# Patient Record
Sex: Male | Born: 1945
Health system: Southern US, Community
[De-identification: ages and names within clinical notes are randomized; demographics above are authoritative.]

## PROBLEM LIST (undated history)

## (undated) DIAGNOSIS — G4730 Sleep apnea, unspecified: Secondary | ICD-10-CM

## (undated) DIAGNOSIS — M1990 Unspecified osteoarthritis, unspecified site: Secondary | ICD-10-CM

## (undated) DIAGNOSIS — I10 Essential (primary) hypertension: Secondary | ICD-10-CM

## (undated) DIAGNOSIS — C4359 Malignant melanoma of other part of trunk: Secondary | ICD-10-CM

## (undated) DIAGNOSIS — M2540 Effusion, unspecified joint: Secondary | ICD-10-CM

## (undated) DIAGNOSIS — M2550 Pain in unspecified joint: Secondary | ICD-10-CM

## (undated) DIAGNOSIS — D649 Anemia, unspecified: Secondary | ICD-10-CM

## (undated) DIAGNOSIS — R011 Cardiac murmur, unspecified: Secondary | ICD-10-CM

## (undated) DIAGNOSIS — T4145XA Adverse effect of unspecified anesthetic, initial encounter: Secondary | ICD-10-CM

## (undated) DIAGNOSIS — T8859XA Other complications of anesthesia, initial encounter: Secondary | ICD-10-CM

## (undated) DIAGNOSIS — K219 Gastro-esophageal reflux disease without esophagitis: Secondary | ICD-10-CM

## (undated) DIAGNOSIS — E7800 Pure hypercholesterolemia, unspecified: Secondary | ICD-10-CM

## (undated) HISTORY — PX: KNEE ARTHROSCOPY: SHX127

## (undated) HISTORY — PX: EYE SURGERY: SHX253

## (undated) HISTORY — PX: TONSILLECTOMY: SUR1361

## (undated) HISTORY — PX: TOTAL KNEE ARTHROPLASTY: SHX125

---

## 1979-02-16 DIAGNOSIS — C4359 Malignant melanoma of other part of trunk: Secondary | ICD-10-CM

## 1979-02-16 HISTORY — PX: MELANOMA EXCISION: SHX5266

## 1979-02-16 HISTORY — DX: Malignant melanoma of other part of trunk: C43.59

## 2004-11-29 ENCOUNTER — Encounter: Admission: RE | Admit: 2004-11-29 | Discharge: 2004-11-29 | Payer: Self-pay | Admitting: Orthopedic Surgery

## 2008-02-04 ENCOUNTER — Inpatient Hospital Stay (HOSPITAL_COMMUNITY): Admission: RE | Admit: 2008-02-04 | Discharge: 2008-02-07 | Payer: Self-pay | Admitting: Orthopedic Surgery

## 2010-10-30 NOTE — Op Note (Signed)
NAME:  Casey Donaldson, Casey Donaldson NO.:  [id number]   MEDICAL RECORD NO.:  [id number]          PATIENT TYPE:  INP   LOCATION:  0002                         FACILITY:  [facility]   PHYSICIAN:  John L. Rendall, M.D.  DATE OF BIRTH:  1946/04/13   DATE OF PROCEDURE:  02/04/2008  DATE OF DISCHARGE:                               OPERATIVE REPORT   PREOPERATIVE DIAGNOSIS:  Osteoarthritis right knee with contractures.   SURGICAL PROCEDURES:  Right LCS total knee arthroplasty with computer  navigation assistance.   POSTOPERATIVE DIAGNOSIS:  Osteoarthritis right knee with contractures.   SURGEON:  John L. Rendall, M.D.   ASSISTANT:  Arnoldo Morale [facility]   ANESTHESIA:  General with femoral nerve block.   PATHOLOGY:  The patient has a worn out medial compartment of the knee  with 9 degrees varus deformity and a fixed flexion contracture of 15  degrees.  He has pain with weightbearing and all conservative measures  have failed to relieve it.   PROCEDURE:  Under general anesthesia with the femoral nerve block, the  right leg was prepared with DuraPrep and draped as a sterile field,  sterile tourniquet is applied proximally.  The leg was wrapped out with  the Esmarch and the tourniquet is elevated at 350 mm.  Midline incision  is made.  The patella was everted.  The knee is debrided in preparation  for computer mapping.  The femur is estimated to be a standard plus.  Using first the computer, the hip center is identified, medial and  lateral malleoli are identified and mapping of the proximal tibia and  distal femur are done within 0.4 mm of reproducible accuracy.  Once this  was completed proximal tibial resection is carried out using the  computer assistance and placement of the cutting jig.  Once this was  complete and within 1 degree of anatomic alignment, the balancing is  then done within 1.5 degrees of anatomic alignment.  The first femoral  cut was then made resecting the anterior and  posterior flare of the  distal femur.  No notching was encountered.  Using the second, the  distal femoral cut was made. Gaps were balanced at approximately 12.5 to  15 mm.  At this point, the lamina spreader was inserted.  Remnants of  the menisci and cruciates were debrided and the posterior femoral  condylar spurs were removed with osteotome and mallet.  At this point,  the recessing guide was used.  Following this, the [facility]  inserted.  Proximal tibia was exposed and sized at a #4.  Due to the  gentleman's excessive truncal obesity, decision was made to use an MBT  revision tray to give more purchase in the proximal tibia to help  prevent early loosening and failure.  Trial components for the number 4  revision tray were then inserted along with a 15 bearing.  This revealed  excellent fit, but there was some laxity in extension and 17.5 bearing  was a much better fit, alignment within 2.5 degrees of anatomic axis,  complete relief of the flexion contracture.  The arrays were taken down  at this point.  Permanent components obtained.  All components were then  cemented in place after preparation of the bony surfaces with pulse  irrigation.  Once cement had hardened, excess was removed with osteotome  and mallet.  The tourniquet was let down at an hour and 10 minutes.  Electrocautery was used and  bleeding was minimal, less than 100 mL.  At this point with all  components cemented in place, the knee was closed in layers with #1  Tycron, #1 Vicryl, 2-0 Vicryl and skin clips.  The patient tolerated the  procedure well and returned to recovery in good condition.      John L. Rendall, M.D.  Electronically Signed     JLR/MEDQ  D:  02/04/2008  T:  02/04/2008  Job:  [id number]

## 2010-11-02 NOTE — Discharge Summary (Signed)
NAME:  Casey Donaldson, SOTH NO.:  [id number]   MEDICAL RECORD NO.:  [id number]          PATIENT TYPE:  INP   LOCATION:  1620                         FACILITY:  [facility]   PHYSICIAN:  John L. Rendall, M.D.  DATE OF BIRTH:  [birth year]   DATE OF ADMISSION:  02/04/2008  DATE OF DISCHARGE:  02/07/2008                               DISCHARGE SUMMARY   ADMISSION DIAGNOSES:  1. End-stage osteoarthritis, right knee.  2. Hypertension.  3. Obesity.  4. Hypercholesterolemia.  5. Sleep apnea.  6. History of malignant melanoma.   DISCHARGE DIAGNOSES:  1. End-stage osteoarthritis, right knee status post right total knee      arthroplasty.  2. Acute blood loss anemia secondary to surgery.  3. Hypertension.  4. Obesity.  5. Hypercholesterolemia.  6. Sleep apnea.  7. History of malignant melanoma.   SURGICAL PROCEDURES:  On February 04, 2008 Mr. Roehrig underwent a right  total knee arthroplasty with computer navigation by Dr. Jonny Ruiz L. Rendall  assisted by Arnoldo Morale, PA-C.  He had an LSC complete RP insert size  standard plus 17.5 thickness placed with an LSC complete metal back  patella cemented size standard plus.  A NBT revision cemented tray size  4 and an LSC complete primary femoral component cemented size standard  plus, right.   Complications:  None.   CONSULTANT:  1. Physical therapy consult, February 04, 2008.  2. Case management consult and occupational therapy consult, February 05, 2008.  3. Respiratory therapy consult, February 06, 2008.   HISTORY OF PRESENT ILLNESS:  This 65 year old white male patient  presented to Dr. Priscille Kluver with a 4-year history of gradual onset of  progressive right knee pain.  The pain is an intermittent dull sensation  over the anterior knee without radiation.  It increases with prolonged  standing and walking and decreases with Aleve and wrapping the knee.  The knee pops and grinds occasionally and keeps him up at night.  He has  failed conservative treatment and because of that he is presenting for  right knee replacement.   HOSPITAL COURSE:  Mr. Gunnels tolerated the surgical procedure well  without immediate postoperative complications.  Postop day #1, he was  afebrile, vitals were stable.  Pain was well controlled.  Hemoglobin was  11.1, hematocrit 32.9.  He was weaned off his oxygen and started on home  teaching and therapy per protocol.   Postop day #2, remained afebrile.  Hemoglobin dropped to 10 with  hematocrit of 29.3.  Dressing was changed.  Incision was well-  approximated with staples.  He was switched to p.o. pain meds and  continued on therapy.   He continued to do well over the next day.  Hemoglobin and hematocrit  remained stable.  Pain was controlled with p.o. pain meds.  It was felt  on the twenty-third that he was ready for discharge home and he was  discharged home later that day.   DISCHARGE INSTRUCTIONS:  1. Diet:  He is to resume his regular prehospitalization diet.  2. Medications:  He is to resume his home meds as  follows:      a.     Hydrochlorothiazide 25 mg p.o. q.a.m.      b.     Lipitor 40 mg p.o. nightly.      c.     Enalapril 20 mg p.o. b.i.d.      d.     Flumethasone ophthalmic solution applied after surgery.      e.     Multivitamin one p.o. q.a.m..      f.     He is to hold his tramadol, aspirin, Aleve and chondroitin       glucosamine and Tylenol at this time.  3. Additional meds at this time include:      a.     Arixtra 2.5 mg subcu q. 8 p.m. with the last dose on August       26.  On the twenty-seventh he may resume his daily aspirin.      b.     Celebrex 200 mg p.o. b.i.d. for 1 week and then 1 tablet       p.o. q.a.m. 30 with no refill.      c.     Percocet 5/325 one to two tablets p.o. q.4h. p.r.n. for pain       60 with no refill.      d.     Robaxin 500 mg 1-2 tablets p.o. q.6h. p.r.n. for spasms 60       with no refill.  4. Activity:  He can be out of bed  weightbearing as tolerated on the      right leg with use of the walker.  He is to have home health and PT      per [facility] and home CPM zero to 90 degrees 6-8      hours a day.  Please see the blue total knee discharge sheet for      further activity instructions.  5. Wound care:  He is to keep his incision clean and dry.  May shower      after no drainage from the wound for 2 days.  Please see the blue      total knee discharge sheet for further wound care instructions.  6. Follow-up:  He is to follow up with Dr. Priscille Kluver in our office on      Tuesday, September 1 and needs to call [phone] for that      appointment.   LABORATORY DATA:  Hemoglobin and hematocrit ranged from 13.5 and 39.7 on  the seventeenth to 10 and 29.3 on the twenty-third.  White count went  from 7.1 on the seventeenth to a high of 11.5 on the twenty-second to  11.1 on the twenty-third.  Sodium dropped to a low of 134 on the twenty-  first and then was within normal limits.  Glucose ranged from 113 on the  seventeenth to 153 on the twenty-second.  All other laboratory studies  were within normal limits.      Legrand Pitts Duffy, P.A.      John L. Rendall, M.D.  Electronically Signed   KED/MEDQ  D:  02/24/2008  T:  02/24/2008  Job:  [id number]   cc:   Gwendlyn Deutscher II, M.D.  Fax: [phone]

## 2013-02-15 DIAGNOSIS — D649 Anemia, unspecified: Secondary | ICD-10-CM

## 2013-02-15 HISTORY — DX: Anemia, unspecified: D64.9

## 2013-02-22 ENCOUNTER — Other Ambulatory Visit: Payer: Self-pay | Admitting: Orthopedic Surgery

## 2013-02-22 ENCOUNTER — Encounter (HOSPITAL_COMMUNITY): Payer: Self-pay | Admitting: Pharmacist

## 2013-02-22 NOTE — Progress Notes (Signed)
HAVE SENT A REQUEST TO [facility] IN [place] [phone]) TO HAVE THEM FAX MOST RECENT EKG, CXR, AND RESULTS FROM 2009 ECHO......THEIR FAX IS [phone]

## 2013-02-23 ENCOUNTER — Inpatient Hospital Stay (HOSPITAL_COMMUNITY)
Admission: RE | Admit: 2013-02-23 | Discharge: 2013-02-26 | DRG: 467 | Disposition: A | Discharge status: Skilled Nursing Facility | Payer: Medicare Other | Source: Ambulatory Visit | Attending: Orthopedic Surgery | Admitting: Orthopedic Surgery

## 2013-02-23 ENCOUNTER — Inpatient Hospital Stay (HOSPITAL_COMMUNITY): Payer: Medicare Other | Admitting: Anesthesiology

## 2013-02-23 ENCOUNTER — Encounter (HOSPITAL_COMMUNITY)
Admission: RE | Disposition: A | Discharge status: Skilled Nursing Facility | Payer: Self-pay | Source: Ambulatory Visit | Attending: Orthopedic Surgery

## 2013-02-23 ENCOUNTER — Inpatient Hospital Stay (HOSPITAL_COMMUNITY): Payer: Medicare Other

## 2013-02-23 ENCOUNTER — Encounter (HOSPITAL_COMMUNITY): Payer: Self-pay | Admitting: *Deleted

## 2013-02-23 ENCOUNTER — Encounter (HOSPITAL_COMMUNITY): Payer: Self-pay | Admitting: Anesthesiology

## 2013-02-23 DIAGNOSIS — T8453XA Infection and inflammatory reaction due to internal right knee prosthesis, initial encounter: Secondary | ICD-10-CM

## 2013-02-23 DIAGNOSIS — Y831 Surgical operation with implant of artificial internal device as the cause of abnormal reaction of the patient, or of later complication, without mention of misadventure at the time of the procedure: Secondary | ICD-10-CM | POA: Diagnosis present

## 2013-02-23 DIAGNOSIS — B954 Other streptococcus as the cause of diseases classified elsewhere: Secondary | ICD-10-CM | POA: Diagnosis present

## 2013-02-23 DIAGNOSIS — Z6841 Body Mass Index (BMI) 40.0 and over, adult: Secondary | ICD-10-CM

## 2013-02-23 DIAGNOSIS — M009 Pyogenic arthritis, unspecified: Secondary | ICD-10-CM

## 2013-02-23 DIAGNOSIS — M869 Osteomyelitis, unspecified: Secondary | ICD-10-CM

## 2013-02-23 DIAGNOSIS — T8450XA Infection and inflammatory reaction due to unspecified internal joint prosthesis, initial encounter: Principal | ICD-10-CM

## 2013-02-23 DIAGNOSIS — Z96659 Presence of unspecified artificial knee joint: Secondary | ICD-10-CM

## 2013-02-23 DIAGNOSIS — I10 Essential (primary) hypertension: Secondary | ICD-10-CM | POA: Diagnosis present

## 2013-02-23 DIAGNOSIS — T8459XA Infection and inflammatory reaction due to other internal joint prosthesis, initial encounter: Secondary | ICD-10-CM

## 2013-02-23 DIAGNOSIS — K219 Gastro-esophageal reflux disease without esophagitis: Secondary | ICD-10-CM | POA: Diagnosis present

## 2013-02-23 DIAGNOSIS — G4730 Sleep apnea, unspecified: Secondary | ICD-10-CM | POA: Diagnosis present

## 2013-02-23 DIAGNOSIS — E871 Hypo-osmolality and hyponatremia: Secondary | ICD-10-CM | POA: Diagnosis present

## 2013-02-23 DIAGNOSIS — R339 Retention of urine, unspecified: Secondary | ICD-10-CM | POA: Diagnosis not present

## 2013-02-23 DIAGNOSIS — Z85820 Personal history of malignant melanoma of skin: Secondary | ICD-10-CM

## 2013-02-23 HISTORY — DX: Cardiac murmur, unspecified: R01.1

## 2013-02-23 HISTORY — PX: EXCISIONAL TOTAL KNEE ARTHROPLASTY WITH ANTIBIOTIC SPACERS: SHX5827

## 2013-02-23 HISTORY — DX: Gastro-esophageal reflux disease without esophagitis: K21.9

## 2013-02-23 HISTORY — DX: Sleep apnea, unspecified: G47.30

## 2013-02-23 HISTORY — DX: Essential (primary) hypertension: I10

## 2013-02-23 LAB — CBC
MCH: 32.6 pg (ref 26.0–34.0)
MCV: 91.8 fL (ref 78.0–100.0)
Platelets: 231 10*3/uL (ref 150–400)
RBC: 3.89 MIL/uL — ABNORMAL LOW (ref 4.22–5.81)

## 2013-02-23 LAB — BASIC METABOLIC PANEL
BUN: 19 mg/dL (ref 6–23)
CO2: 23 mEq/L (ref 19–32)
Calcium: 11.1 mg/dL — ABNORMAL HIGH (ref 8.4–10.5)
Creatinine, Ser: 0.76 mg/dL (ref 0.50–1.35)
Glucose, Bld: 108 mg/dL — ABNORMAL HIGH (ref 70–99)

## 2013-02-23 SURGERY — REMOVAL, TOTAL ARTHROPLASTY HARDWARE, KNEE, WITH ANTIBIOTIC SPACER INSERTION
Anesthesia: General | Site: Knee | Laterality: Right | Wound class: Dirty or Infected

## 2013-02-23 MED ORDER — CEFAZOLIN SODIUM-DEXTROSE 2-3 GM-% IV SOLR
2.0000 g | Freq: Four times a day (QID) | INTRAVENOUS | Status: DC
  Administered 2013-02-23 – 2013-02-24 (×3): 2 g via INTRAVENOUS
  Filled 2013-02-23 (×6): qty 50

## 2013-02-23 MED ORDER — CEFTRIAXONE SODIUM 2 G IJ SOLR
2.0000 g | INTRAMUSCULAR | Status: DC
  Administered 2013-02-24 – 2013-02-25 (×2): 2 g via INTRAVENOUS
  Filled 2013-02-23 (×4): qty 2

## 2013-02-23 MED ORDER — ACETAMINOPHEN 325 MG PO TABS: 650.0000 mg | ORAL_TABLET | Freq: Four times a day (QID) | ORAL | Status: DC | PRN

## 2013-02-23 MED ORDER — SUCCINYLCHOLINE CHLORIDE 20 MG/ML IJ SOLN: INTRAMUSCULAR | Status: DC | PRN

## 2013-02-23 MED ORDER — ALBUTEROL SULFATE (5 MG/ML) 0.5% IN NEBU
INHALATION_SOLUTION | RESPIRATORY_TRACT | Status: AC
  Filled 2013-02-23: qty 0.5

## 2013-02-23 MED ORDER — FENTANYL CITRATE 0.05 MG/ML IJ SOLN
INTRAMUSCULAR | Status: DC | PRN
  Administered 2013-02-23 (×2): 50 ug via INTRAVENOUS
  Administered 2013-02-23 (×2): 100 ug via INTRAVENOUS
  Administered 2013-02-23 (×3): 50 ug via INTRAVENOUS
  Administered 2013-02-23: 100 ug via INTRAVENOUS

## 2013-02-23 MED ORDER — MIDAZOLAM HCL 5 MG/5ML IJ SOLN
INTRAMUSCULAR | Status: DC | PRN
  Administered 2013-02-23: 2 mg via INTRAVENOUS

## 2013-02-23 MED ORDER — OXYCODONE HCL 5 MG PO TABS: 5.0000 mg | ORAL_TABLET | Freq: Once | ORAL | Status: DC | PRN

## 2013-02-23 MED ORDER — METOCLOPRAMIDE HCL 5 MG/ML IJ SOLN
INTRAMUSCULAR | Status: DC | PRN
  Administered 2013-02-23: 10 mg via INTRAVENOUS

## 2013-02-23 MED ORDER — ATORVASTATIN CALCIUM 10 MG PO TABS
10.0000 mg | ORAL_TABLET | Freq: Every day | ORAL | Status: DC
  Administered 2013-02-24 – 2013-02-25 (×2): 10 mg via ORAL
  Filled 2013-02-23 (×3): qty 1

## 2013-02-23 MED ORDER — FLUTICASONE PROPIONATE 50 MCG/ACT NA SUSP
2.0000 | Freq: Every day | NASAL | Status: DC
  Administered 2013-02-23 – 2013-02-25 (×3): 2 via NASAL
  Filled 2013-02-23 (×2): qty 16

## 2013-02-23 MED ORDER — GLYCOPYRROLATE 0.2 MG/ML IJ SOLN
INTRAMUSCULAR | Status: DC | PRN
  Administered 2013-02-23: .8 mg via INTRAVENOUS

## 2013-02-23 MED ORDER — NEOSTIGMINE METHYLSULFATE 1 MG/ML IJ SOLN
INTRAMUSCULAR | Status: DC | PRN
  Administered 2013-02-23: 5 mg via INTRAVENOUS

## 2013-02-23 MED ORDER — ALBUTEROL SULFATE HFA 108 (90 BASE) MCG/ACT IN AERS: 2.0000 | INHALATION_SPRAY | RESPIRATORY_TRACT | Status: DC

## 2013-02-23 MED ORDER — HYDROMORPHONE HCL PF 1 MG/ML IJ SOLN
INTRAMUSCULAR | Status: AC
  Filled 2013-02-23: qty 1

## 2013-02-23 MED ORDER — VANCOMYCIN HCL 1000 MG IV SOLR
INTRAVENOUS | Status: DC | PRN
  Administered 2013-02-23 (×4): 1000 mg

## 2013-02-23 MED ORDER — LACTATED RINGERS IV SOLN
INTRAVENOUS | Status: DC
  Administered 2013-02-23: 14:00:00 via INTRAVENOUS

## 2013-02-23 MED ORDER — LIDOCAINE HCL (CARDIAC) 20 MG/ML IV SOLN
INTRAVENOUS | Status: DC | PRN
  Administered 2013-02-23: 65 mg via INTRAVENOUS

## 2013-02-23 MED ORDER — ALBUTEROL SULFATE (5 MG/ML) 0.5% IN NEBU
2.5000 mg | INHALATION_SOLUTION | Freq: Four times a day (QID) | RESPIRATORY_TRACT | Status: DC | PRN
  Administered 2013-02-23: 2.5 mg via RESPIRATORY_TRACT

## 2013-02-23 MED ORDER — MINERAL OIL LIGHT 100 % EX OIL
TOPICAL_OIL | CUTANEOUS | Status: AC
  Filled 2013-02-23: qty 25

## 2013-02-23 MED ORDER — SODIUM CHLORIDE 0.9 % IJ SOLN: 9.0000 mL | INTRAMUSCULAR | Status: DC | PRN

## 2013-02-23 MED ORDER — FELODIPINE ER 5 MG PO TB24
5.0000 mg | ORAL_TABLET | Freq: Every morning | ORAL | Status: DC
  Administered 2013-02-24 – 2013-02-26 (×3): 5 mg via ORAL
  Filled 2013-02-23 (×3): qty 1

## 2013-02-23 MED ORDER — METHOCARBAMOL 100 MG/ML IJ SOLN
500.0000 mg | Freq: Four times a day (QID) | INTRAVENOUS | Status: DC | PRN
  Filled 2013-02-23: qty 5

## 2013-02-23 MED ORDER — MORPHINE SULFATE (PF) 1 MG/ML IV SOLN
INTRAVENOUS | Status: DC
  Administered 2013-02-23 – 2013-02-24 (×2): via INTRAVENOUS
  Administered 2013-02-24: 4 mg via INTRAVENOUS
  Filled 2013-02-23: qty 25

## 2013-02-23 MED ORDER — POTASSIUM CHLORIDE IN NACL 20-0.9 MEQ/L-% IV SOLN
INTRAVENOUS | Status: DC
  Filled 2013-02-23 (×2): qty 1000

## 2013-02-23 MED ORDER — VANCOMYCIN HCL 1000 MG IV SOLR
INTRAVENOUS | Status: AC
  Filled 2013-02-23: qty 3000

## 2013-02-23 MED ORDER — MORPHINE SULFATE (PF) 1 MG/ML IV SOLN
INTRAVENOUS | Status: AC
  Administered 2013-02-24: 13.72 mg
  Filled 2013-02-23: qty 25

## 2013-02-23 MED ORDER — ONDANSETRON HCL 4 MG/2ML IJ SOLN
INTRAMUSCULAR | Status: DC | PRN
  Administered 2013-02-23: 4 mg via INTRAVENOUS

## 2013-02-23 MED ORDER — PROPOFOL 10 MG/ML IV BOLUS
INTRAVENOUS | Status: DC | PRN
  Administered 2013-02-23: 160 mg via INTRAVENOUS

## 2013-02-23 MED ORDER — ARTIFICIAL TEARS OP OINT
TOPICAL_OINTMENT | OPHTHALMIC | Status: DC | PRN
  Administered 2013-02-23: 1 via OPHTHALMIC

## 2013-02-23 MED ORDER — ONDANSETRON HCL 4 MG PO TABS
4.0000 mg | ORAL_TABLET | Freq: Four times a day (QID) | ORAL | Status: DC | PRN
  Administered 2013-02-24: 4 mg via ORAL
  Filled 2013-02-23: qty 1

## 2013-02-23 MED ORDER — LACTATED RINGERS IV SOLN
INTRAVENOUS | Status: DC | PRN
  Administered 2013-02-23 (×2): via INTRAVENOUS

## 2013-02-23 MED ORDER — NAFCILLIN SODIUM 1 G IJ SOLR
1.0000 g | INTRAMUSCULAR | Status: AC
  Administered 2013-02-23: 1 g via INTRAVENOUS
  Filled 2013-02-23: qty 1000

## 2013-02-23 MED ORDER — PHENYLEPHRINE HCL 10 MG/ML IJ SOLN
INTRAMUSCULAR | Status: DC | PRN
  Administered 2013-02-23: 40 ug via INTRAVENOUS
  Administered 2013-02-23 (×3): 80 ug via INTRAVENOUS

## 2013-02-23 MED ORDER — ROCURONIUM BROMIDE 100 MG/10ML IV SOLN
INTRAVENOUS | Status: DC | PRN
  Administered 2013-02-23: 30 mg via INTRAVENOUS
  Administered 2013-02-23: 50 mg via INTRAVENOUS
  Administered 2013-02-23: 20 mg via INTRAVENOUS

## 2013-02-23 MED ORDER — OXYCODONE HCL 5 MG PO TABS
5.0000 mg | ORAL_TABLET | ORAL | Status: DC | PRN
  Administered 2013-02-25: 5 mg via ORAL
  Administered 2013-02-26: 10 mg via ORAL
  Filled 2013-02-23: qty 1
  Filled 2013-02-23: qty 2

## 2013-02-23 MED ORDER — ENALAPRIL MALEATE 20 MG PO TABS
20.0000 mg | ORAL_TABLET | Freq: Two times a day (BID) | ORAL | Status: DC
  Administered 2013-02-23 – 2013-02-26 (×6): 20 mg via ORAL
  Filled 2013-02-23 (×7): qty 1

## 2013-02-23 MED ORDER — PROMETHAZINE HCL 25 MG/ML IJ SOLN: 6.2500 mg | INTRAMUSCULAR | Status: DC | PRN

## 2013-02-23 MED ORDER — ACETAMINOPHEN 650 MG RE SUPP: 650.0000 mg | Freq: Four times a day (QID) | RECTAL | Status: DC | PRN

## 2013-02-23 MED ORDER — MINERAL OIL LIGHT 100 % EX OIL
TOPICAL_OIL | CUTANEOUS | Status: DC | PRN
  Administered 2013-02-23: 1 via TOPICAL

## 2013-02-23 MED ORDER — VANCOMYCIN HCL 10 G IV SOLR
1500.0000 mg | Freq: Once | INTRAVENOUS | Status: AC
  Administered 2013-02-23: 1500 mg via INTRAVENOUS

## 2013-02-23 MED ORDER — BUPIVACAINE HCL (PF) 0.25 % IJ SOLN
INTRAMUSCULAR | Status: AC
  Filled 2013-02-23: qty 30

## 2013-02-23 MED ORDER — CEFAZOLIN (ANCEF) 1 G IV SOLR
INTRAVENOUS | Status: DC | PRN
  Administered 2013-02-23: 1 g via INTRAMUSCULAR

## 2013-02-23 MED ORDER — ALBUTEROL SULFATE HFA 108 (90 BASE) MCG/ACT IN AERS
INHALATION_SPRAY | RESPIRATORY_TRACT | Status: DC | PRN
  Administered 2013-02-23: 6 via RESPIRATORY_TRACT
  Administered 2013-02-23: 3 via RESPIRATORY_TRACT

## 2013-02-23 MED ORDER — SODIUM CHLORIDE 0.9 % IR SOLN
Status: DC | PRN
  Administered 2013-02-23: 6000 mL

## 2013-02-23 MED ORDER — VANCOMYCIN HCL 1000 MG IV SOLR
INTRAVENOUS | Status: AC
  Filled 2013-02-23: qty 1000

## 2013-02-23 MED ORDER — HYDROMORPHONE HCL PF 1 MG/ML IJ SOLN
0.2500 mg | INTRAMUSCULAR | Status: DC | PRN
  Administered 2013-02-23 (×2): 0.25 mg via INTRAVENOUS

## 2013-02-23 MED ORDER — BUPIVACAINE-EPINEPHRINE (PF) 0.5% -1:200000 IJ SOLN
INTRAMUSCULAR | Status: AC
  Filled 2013-02-23: qty 10

## 2013-02-23 MED ORDER — CEFAZOLIN (ANCEF) 1 G IV SOLR
1.0000 g | INTRAVENOUS | Status: AC
  Administered 2013-02-23: 1 g
  Filled 2013-02-23: qty 1

## 2013-02-23 MED ORDER — NAFCILLIN SODIUM 1 G IJ SOLR
1.0000 g | Freq: Once | INTRAMUSCULAR | Status: DC
  Filled 2013-02-23: qty 1000

## 2013-02-23 MED ORDER — NALOXONE HCL 0.4 MG/ML IJ SOLN: 0.4000 mg | INTRAMUSCULAR | Status: DC | PRN

## 2013-02-23 MED ORDER — WARFARIN SODIUM 7.5 MG PO TABS
7.5000 mg | ORAL_TABLET | Freq: Once | ORAL | Status: AC
  Administered 2013-02-24: 7.5 mg via ORAL
  Filled 2013-02-23 (×2): qty 1

## 2013-02-23 MED ORDER — METHOCARBAMOL 500 MG PO TABS
500.0000 mg | ORAL_TABLET | Freq: Four times a day (QID) | ORAL | Status: DC | PRN
  Administered 2013-02-25: 500 mg via ORAL
  Filled 2013-02-23: qty 1

## 2013-02-23 MED ORDER — DIPHENHYDRAMINE HCL 50 MG/ML IJ SOLN: 12.5000 mg | Freq: Four times a day (QID) | INTRAMUSCULAR | Status: DC | PRN

## 2013-02-23 MED ORDER — VANCOMYCIN HCL IN DEXTROSE 1-5 GM/200ML-% IV SOLN
INTRAVENOUS | Status: AC
  Filled 2013-02-23: qty 400

## 2013-02-23 MED ORDER — METOCLOPRAMIDE HCL 5 MG/ML IJ SOLN: 5.0000 mg | Freq: Three times a day (TID) | INTRAMUSCULAR | Status: DC | PRN

## 2013-02-23 MED ORDER — OXYCODONE HCL 5 MG/5ML PO SOLN
5.0000 mg | Freq: Once | ORAL | Status: DC | PRN
Start: 2013-02-23 — End: 2013-02-23

## 2013-02-23 MED ORDER — ONDANSETRON HCL 4 MG/2ML IJ SOLN: 4.0000 mg | Freq: Four times a day (QID) | INTRAMUSCULAR | Status: DC | PRN

## 2013-02-23 MED ORDER — CEFAZOLIN (ANCEF) 1 G IV SOLR
1.0000 g | INTRAVENOUS | Status: AC
  Filled 2013-02-23: qty 1

## 2013-02-23 MED ORDER — WARFARIN - PHARMACIST DOSING INPATIENT: Freq: Every day | Status: DC

## 2013-02-23 MED ORDER — CLONIDINE HCL (ANALGESIA) 100 MCG/ML EP SOLN
150.0000 ug | Freq: Once | EPIDURAL | Status: DC
  Filled 2013-02-23: qty 1.5

## 2013-02-23 MED ORDER — DIPHENHYDRAMINE HCL 12.5 MG/5ML PO ELIX: 12.5000 mg | ORAL_SOLUTION | Freq: Four times a day (QID) | ORAL | Status: DC | PRN

## 2013-02-23 MED ORDER — LABETALOL HCL 5 MG/ML IV SOLN
INTRAVENOUS | Status: DC | PRN
  Administered 2013-02-23: 2.5 mg via INTRAVENOUS
  Administered 2013-02-23: 5 mg via INTRAVENOUS

## 2013-02-23 MED ORDER — METOCLOPRAMIDE HCL 10 MG PO TABS: 5.0000 mg | ORAL_TABLET | Freq: Three times a day (TID) | ORAL | Status: DC | PRN

## 2013-02-23 MED ORDER — 0.9 % SODIUM CHLORIDE (POUR BTL) OPTIME
TOPICAL | Status: DC | PRN
  Administered 2013-02-23: 3000 mL

## 2013-02-23 MED ORDER — PHENOL 1.4 % MT LIQD: 1.0000 | OROMUCOSAL | Status: DC | PRN

## 2013-02-23 MED ORDER — NAFCILLIN SODIUM 1 G IJ SOLR
INTRAMUSCULAR | Status: DC | PRN
  Administered 2013-02-23: 1 g via INTRAVENOUS

## 2013-02-23 MED ORDER — MORPHINE SULFATE 4 MG/ML IJ SOLN: 4.0000 mg | INTRAMUSCULAR | Status: DC | PRN

## 2013-02-23 MED ORDER — MENTHOL 3 MG MT LOZG
1.0000 | LOZENGE | OROMUCOSAL | Status: DC | PRN
  Filled 2013-02-23: qty 9

## 2013-02-23 SURGICAL SUPPLY — 103 items
BANDAGE ELASTIC 4 VELCRO ST LF (GAUZE/BANDAGES/DRESSINGS) IMPLANT
BANDAGE ESMARK 6X9 LF (GAUZE/BANDAGES/DRESSINGS) IMPLANT
BLADE SAW SGTL 13.0X1.19X90.0M (BLADE) IMPLANT
BLADE SAW SGTL 81X20 HD (BLADE) ×4 IMPLANT
BLADE SURG 10 STRL SS (BLADE) ×10 IMPLANT
BLADE SURG 15 STRL LF DISP TIS (BLADE) ×2 IMPLANT
BLADE SURG 15 STRL SS (BLADE) ×4
BNDG CMPR 9X6 STRL LF SNTH (GAUZE/BANDAGES/DRESSINGS)
BNDG CMPR MED 10X6 ELC LF (GAUZE/BANDAGES/DRESSINGS) ×1
BNDG COHESIVE 6X5 TAN STRL LF (GAUZE/BANDAGES/DRESSINGS) IMPLANT
BNDG ELASTIC 6X10 VLCR STRL LF (GAUZE/BANDAGES/DRESSINGS) ×2 IMPLANT
BNDG ESMARK 6X9 LF (GAUZE/BANDAGES/DRESSINGS)
BOWL SMART MIX CTS (DISPOSABLE) ×4 IMPLANT
CATH URET WHISTLE 8FR 331008 (CATHETERS) ×2 IMPLANT
CEMENT HV SMART SET (Cement) ×8 IMPLANT
CLOTH BEACON ORANGE TIMEOUT ST (SAFETY) ×2 IMPLANT
CO AXIAL FAN SPRAY TIP SOFT SH (MISCELLANEOUS) ×2 IMPLANT
COVER BACK TABLE 24X17X13 BIG (DRAPES) IMPLANT
COVER SURGICAL LIGHT HANDLE (MISCELLANEOUS) ×2 IMPLANT
CUFF TOURNIQUET SINGLE 34IN LL (TOURNIQUET CUFF) IMPLANT
CUFF TOURNIQUET SINGLE 44IN (TOURNIQUET CUFF) ×2 IMPLANT
DRAPE INCISE IOBAN 66X45 STRL (DRAPES) ×4 IMPLANT
DRAPE ORTHO SPLIT 77X108 STRL (DRAPES) ×6
DRAPE PROXIMA HALF (DRAPES) ×2 IMPLANT
DRAPE SURG ORHT 6 SPLT 77X108 (DRAPES) ×3 IMPLANT
DRAPE U-SHAPE 47X51 STRL (DRAPES) ×2 IMPLANT
DRAPE X-RAY CASS 24X20 (DRAPES) IMPLANT
DRSG PAD ABDOMINAL 8X10 ST (GAUZE/BANDAGES/DRESSINGS) ×4 IMPLANT
DURAPREP 26ML APPLICATOR (WOUND CARE) ×2 IMPLANT
ELECT REM PT RETURN 9FT ADLT (ELECTROSURGICAL) ×2
ELECTRODE REM PT RTRN 9FT ADLT (ELECTROSURGICAL) ×1 IMPLANT
EVACUATOR 1/8 PVC DRAIN (DRAIN) IMPLANT
FACESHIELD LNG OPTICON STERILE (SAFETY) ×2 IMPLANT
FLUID NSS /IRRIG 3000 ML XXX (IV SOLUTION) IMPLANT
GAUZE XEROFORM 5X9 LF (GAUZE/BANDAGES/DRESSINGS) ×2 IMPLANT
GLOVE BIO SURGEON ST LM GN SZ9 (GLOVE) IMPLANT
GLOVE BIO SURGEON STRL SZ 6.5 (GLOVE) ×6 IMPLANT
GLOVE BIOGEL M 7.0 STRL (GLOVE) ×6 IMPLANT
GLOVE BIOGEL PI IND STRL 6 (GLOVE) ×1 IMPLANT
GLOVE BIOGEL PI IND STRL 6.5 (GLOVE) ×1 IMPLANT
GLOVE BIOGEL PI IND STRL 7.0 (GLOVE) ×1 IMPLANT
GLOVE BIOGEL PI IND STRL 7.5 (GLOVE) ×3 IMPLANT
GLOVE BIOGEL PI IND STRL 8 (GLOVE) ×1 IMPLANT
GLOVE BIOGEL PI INDICATOR 6 (GLOVE) ×1
GLOVE BIOGEL PI INDICATOR 6.5 (GLOVE) ×1
GLOVE BIOGEL PI INDICATOR 7.0 (GLOVE) ×1
GLOVE BIOGEL PI INDICATOR 7.5 (GLOVE) ×3
GLOVE BIOGEL PI INDICATOR 8 (GLOVE) ×1
GLOVE BIOGEL PI ORTHO PRO 7.5 (GLOVE) ×1
GLOVE ECLIPSE 7.0 STRL STRAW (GLOVE) ×2 IMPLANT
GLOVE ECLIPSE 7.5 STRL STRAW (GLOVE) ×2 IMPLANT
GLOVE PI ORTHO PRO STRL 7.5 (GLOVE) ×1 IMPLANT
GLOVE SS BIOGEL STRL SZ 8 (GLOVE) ×2 IMPLANT
GLOVE SUPERSENSE BIOGEL SZ 8 (GLOVE) ×2
GLOVE SURG ORTHO 8.0 STRL STRW (GLOVE) ×2 IMPLANT
GLOVE SURG SS PI 7.5 STRL IVOR (GLOVE) ×2 IMPLANT
GOWN PREVENTION PLUS LG XLONG (DISPOSABLE) IMPLANT
GOWN PREVENTION PLUS XLARGE (GOWN DISPOSABLE) ×8 IMPLANT
GOWN STRL NON-REIN LRG LVL3 (GOWN DISPOSABLE) ×6 IMPLANT
GOWN STRL REIN XL XLG (GOWN DISPOSABLE) ×2 IMPLANT
HANDPIECE INTERPULSE COAX TIP (DISPOSABLE) ×2
HOOD PEEL AWAY FACE SHEILD DIS (HOOD) ×14 IMPLANT
IMMOBILIZER KNEE 20 (SOFTGOODS)
IMMOBILIZER KNEE 20 THIGH 36 (SOFTGOODS) IMPLANT
IMMOBILIZER KNEE 22 UNIV (SOFTGOODS) IMPLANT
IMMOBILIZER KNEE 24 THIGH 36 (MISCELLANEOUS) ×1 IMPLANT
IMMOBILIZER KNEE 24 UNIV (MISCELLANEOUS) ×2
KIT BASIN OR (CUSTOM PROCEDURE TRAY) ×2 IMPLANT
KIT ROOM TURNOVER OR (KITS) ×2 IMPLANT
KIT STIMULAN RAPID CURE  10CC (Orthopedic Implant) ×2 IMPLANT
KIT STIMULAN RAPID CURE 10CC (Orthopedic Implant) ×2 IMPLANT
MANIFOLD NEPTUNE II (INSTRUMENTS) ×2 IMPLANT
NEEDLE 18GX1X1/2 (RX/OR ONLY) (NEEDLE) IMPLANT
NEEDLE SPNL 18GX3.5 QUINCKE PK (NEEDLE) IMPLANT
NS IRRIG 1000ML POUR BTL (IV SOLUTION) ×2 IMPLANT
PACK TOTAL JOINT (CUSTOM PROCEDURE TRAY) ×2 IMPLANT
PAD ARMBOARD 7.5X6 YLW CONV (MISCELLANEOUS) ×4 IMPLANT
PAD CAST 4YDX4 CTTN HI CHSV (CAST SUPPLIES) ×1 IMPLANT
PADDING CAST COTTON 4X4 STRL (CAST SUPPLIES) ×2
PADDING CAST COTTON 6X4 STRL (CAST SUPPLIES) ×2 IMPLANT
RUBBERBAND STERILE (MISCELLANEOUS) IMPLANT
SET HNDPC FAN SPRY TIP SCT (DISPOSABLE) ×1 IMPLANT
SPONGE GAUZE 4X4 12PLY (GAUZE/BANDAGES/DRESSINGS) ×2 IMPLANT
SPONGE LAP 18X18 X RAY DECT (DISPOSABLE) ×2 IMPLANT
STAPLER VISISTAT 35W (STAPLE) ×2 IMPLANT
SUCTION FRAZIER TIP 10 FR DISP (SUCTIONS) ×2 IMPLANT
SUT ETHILON 3 0 PS 1 (SUTURE) ×4 IMPLANT
SUT VIC AB 0 CT1 27 (SUTURE) ×4
SUT VIC AB 0 CT1 27XBRD ANBCTR (SUTURE) ×2 IMPLANT
SUT VIC AB 0 CTB1 27 (SUTURE) ×4 IMPLANT
SUT VIC AB 1 CT1 27 (SUTURE) ×10
SUT VIC AB 1 CT1 27XBRD ANBCTR (SUTURE) ×5 IMPLANT
SUT VIC AB 2-0 CT1 27 (SUTURE) ×6
SUT VIC AB 2-0 CT1 TAPERPNT 27 (SUTURE) ×3 IMPLANT
SWAB COLLECTION DEVICE MRSA (MISCELLANEOUS) ×4 IMPLANT
SYR 30ML SLIP (SYRINGE) IMPLANT
SYR TB 1ML LUER SLIP (SYRINGE) IMPLANT
TOWEL OR 17X24 6PK STRL BLUE (TOWEL DISPOSABLE) ×2 IMPLANT
TOWEL OR 17X26 10 PK STRL BLUE (TOWEL DISPOSABLE) ×8 IMPLANT
TRAY FOLEY CATH 16FRSI W/METER (SET/KITS/TRAYS/PACK) ×2 IMPLANT
TUBE ANAEROBIC SPECIMEN COL (MISCELLANEOUS) ×4 IMPLANT
WATER STERILE IRR 1000ML POUR (IV SOLUTION) ×2 IMPLANT
YANKAUER SUCT BULB TIP NO VENT (SUCTIONS) ×2 IMPLANT

## 2013-02-23 NOTE — Progress Notes (Signed)
Patient placed on CPAP of 14.0 per home settings at this time. Tolerating well, RT will continue to monitor.

## 2013-02-23 NOTE — Anesthesia Preprocedure Evaluation (Addendum)
Anesthesia Evaluation  Patient identified by MRN, date of birth, ID band Patient awake    Reviewed: Allergy & Precautions, H&P , NPO status   History of Anesthesia Complications Negative for: history of anesthetic complications  Airway   Neck ROM: Full    Dental  (+) Teeth Intact and Dental Advisory Given   Pulmonary neg pulmonary ROS,  breath sounds clear to auscultation        Cardiovascular negative cardio ROS  Rhythm:Regular Rate:Normal     Neuro/Psych    GI/Hepatic negative GI ROS,   Endo/Other  Morbid obesity  Renal/GU negative Renal ROS     Musculoskeletal   Abdominal (+) + obese,   Peds  Hematology   Anesthesia Other Findings   Reproductive/Obstetrics                          Anesthesia Physical Anesthesia Plan  ASA: II  Anesthesia Plan: General   Post-op Pain Management:    Induction: Intravenous  Airway Management Planned: Oral ETT  Additional Equipment:   Intra-op Plan:   Post-operative Plan: Extubation in OR  Informed Consent: I have reviewed the patients History and Physical, chart, labs and discussed the procedure including the risks, benefits and alternatives for the proposed anesthesia with the patient or authorized representative who has indicated his/her understanding and acceptance.   Dental advisory given  Plan Discussed with:   Anesthesia Plan Comments:         Anesthesia Quick Evaluation

## 2013-02-23 NOTE — H&P (Signed)
Casey Donaldson. is an 67 y.o. male.   Chief Complaint: Right knee pain HPI: Casey Donaldson is an active 67 year old retired patient who underwent total knee replacement by Dr. Charma Igo 5 years ago. He is doing well until recently. He underwent tooth cleaning approximately 2 weeks ago and did take antibiotics prior to the tooth cleaning. He subsequently had a filling placed without antibiotic coverage. Developed knee pain abrupt onset seen thereafter. He was seen in the clinic last week. Aspiration performed on Friday has grown strep viridans. He was placed on antibiotics Sunday. He reports pain with any attempted ambulation and describes swelling in the knee. The patient's white count at the time was 16,000 however his sedimentation rate was 6 and C-reactive protein was normal. He has described one episode of chills only and has been running low-grade fever in the 100.2-100.4 range  No past medical history on file.  No past surgical history on file.  No family history on file. Social History:  has no tobacco, alcohol, and drug history on file.  Allergies: No Known Allergies  No prescriptions prior to admission    No results found for this or any previous visit (from the past 48 hour(s)). No results found.  Review of Systems  Constitutional: Positive for chills.  HENT: Negative.   Eyes: Negative.   Respiratory: Negative.   Cardiovascular: Negative.   Gastrointestinal: Negative.   Genitourinary: Negative.   Musculoskeletal: Positive for joint pain.  Skin: Negative.   Neurological: Negative.   Endo/Heme/Allergies: Negative.   Psychiatric/Behavioral: Negative.     Height 5' 9.5" (1.765 m), weight 131.543 kg (290 lb). Physical Exam  Constitutional: He appears well-developed.  HENT:  Head: Normocephalic.  Eyes: Pupils are equal, round, and reactive to light.  Neck: Normal range of motion.  Cardiovascular: Normal rate.   Respiratory: Effort normal.  GI: Soft.  Neurological: He is  alert.  Skin: Skin is warm.  Psychiatric: He has a normal mood and affect.   examination of the right knee demonstrates warmth effusion intact extensor mechanism painful range of motion stable collateral ligaments palpable pedal pulses no defect palpable in the patellar tendon region pedal pulses palpable no proximal lymphadenopathy  Assessment/Plan Impression is right TKA with strep viridans infection. Aspirate was slightly bloody but is growing strep viradans. He's been placed on all antibiotics pending the surgical component extraction. The risk of extraction are discussed with the patient including but not limited to persistent infection as well as the definite need for long-term antibiotics and eventual reimplantation of the revision components. Patient understands the risk and benefits and wished to proceed. All questions answered.  Lasharn Bufkin SCOTT 02/23/2013, 7:17 AM

## 2013-02-23 NOTE — Anesthesia Postprocedure Evaluation (Signed)
  Anesthesia Post-op Note  Patient: [name].  Procedure(s) Performed: Procedure(s) with comments: EXCISIONAL TOTAL KNEE ARTHROPLASTY WITH ANTIBIOTIC SPACERS (Right) - Right Total Knee Arthroplasty Componenet Removal, Antibiotic Spacer Placement.  Patient Location: PACU  Anesthesia Type:General  Level of Consciousness: awake, alert , oriented and patient cooperative  Airway and Oxygen Therapy: Patient Spontanous Breathing and Patient connected to nasal cannula oxygen  Post-op Pain: none  Post-op Assessment: Post-op Vital signs reviewed, Patient's Cardiovascular Status Stable, Respiratory Function Stable, Patent Airway, No signs of Nausea or vomiting and Pain level controlled  Post-op Vital Signs: Reviewed and stable  Complications: No apparent anesthesia complications

## 2013-02-23 NOTE — Transfer of Care (Signed)
Immediate Anesthesia Transfer of Care Note  Patient: Casey Donaldson.  Procedure(s) Performed: Procedure(s) with comments: EXCISIONAL TOTAL KNEE ARTHROPLASTY WITH ANTIBIOTIC SPACERS (Right) - Right Total Knee Arthroplasty Componenet Removal, Antibiotic Spacer Placement.  Patient Location: PACU  Anesthesia Type:General  Level of Consciousness: responds to stimulation  Airway & Oxygen Therapy: Patient Spontanous Breathing and Patient connected to face mask oxygen  Post-op Assessment: Report given to PACU RN and Post -op Vital signs reviewed and stable  Post vital signs: Reviewed and stable  Complications: No apparent anesthesia complications

## 2013-02-23 NOTE — Progress Notes (Signed)
ANTICOAGULATION CONSULT NOTE - Initial Consult  Pharmacy Consult for Coumadin Indication: VTE prophylaxis  No Known Allergies Patient Measurements: Height: 5' 9.5" (176.5 cm) Weight: 290 lb (131.543 kg) IBW/kg (Calculated) : 71.85 Vital Signs: Temp: 99.5 F (37.5 C) (09/09 2151) Temp src: Oral (09/09 1400) BP: 147/61 mmHg (09/09 2151) Pulse Rate: 79 (09/09 2151) Labs:  Recent Labs  02/23/13 1404  HGB 12.7*  HCT 35.7*  PLT 231  CREATININE 0.76   Estimated Creatinine Clearance: 124.6 ml/min (by C-G formula based on Cr of 0.76).  Medical History: Past Medical History  Diagnosis Date  . Hypertension   . Heart murmur   . Sleep apnea     cpap  . GERD (gastroesophageal reflux disease)   . Cancer     melanoma   Assessment: 66 YOM status post TKA to start Coumadin for VTE prophylaxis. Patient was only on aspirin prior to admission and has no history of liver dysfunction. Baseline CBC wnl. Patient is on Ancef and Rocephin for strep infection of the knee.    Goal of Therapy:  INR 2-3   Plan:  1. Coumadin 7.5mg  po x1 tonight if STAT INR ok.  2. Daily PT/INR  Link Snuffer, PharmD, BCPS Clinical Pharmacist [phone] 02/23/2013,10:23 PM

## 2013-02-23 NOTE — Brief Op Note (Signed)
02/23/2013  8:44 PM  PATIENT:  Casey Donaldson.  67 y.o. male  PRE-OPERATIVE DIAGNOSIS:  Right Knee Infection  POST-OPERATIVE DIAGNOSIS:  Right Knee Infection  PROCEDURE:  Procedure(s): EXCISIONAL TOTAL KNEE ARTHROPLASTY WITH ANTIBIOTIC SPACERS  SURGEON:  Surgeon(s): Cammy Copa, MD  ASSISTANT: B roberts pa  ANESTHESIA:   general  EBL: 250 ml    Total I/O In: 500 [I.V.:500] Out: -   BLOOD ADMINISTERED: none  DRAINS: none   LOCAL MEDICATIONS USED:  Ivanco impregnated abx cement - ancef/nafcillin stimulan beads  SPECIMEN:  cxs x 2  COUNTS:  YES  TOURNIQUET:  120 min at 300 mm hg  DICTATION: .Other Dictation: Dictation Number [id number]  PLAN OF CARE: Admit to inpatient   PATIENT DISPOSITION:  PACU - hemodynamically stable

## 2013-02-23 NOTE — Preoperative (Signed)
Beta Blockers   Reason not to administer Beta Blockers:Not Applicable 

## 2013-02-23 NOTE — Consult Note (Signed)
INFECTIOUS DISEASE CONSULT NOTE  Date of Admission:  02/23/2013  Date of Consult:  02/23/2013  Reason for Consult: Septic arthritis/osteomyelitis, infected TKR Referring Physician: August Saucer  Impression/Recommendation Septic arthritis/osteomyelitis, infected TKR  Would Start pt on ceftriaxone 2g qday Await operative cx Will not recheck his ESR and CRP- these previously were normal.   Comment Agree with Dr August Saucer that a 2 stage approach offers best option for cure at this point. WIll plan for long term therapy, and plan to see him back in our clinic. Discussed with Dr Diamantina Providence PA- pt had extensive bone involvement, all hardware out. Typically would plan for 6 weeks of treatment, he may need longer.   Thank you so much for this interesting consult,   Casey Donaldson (pager) [phone] [email or web address]  Casey Donaldson. is an 67 y.o. male.  HPI: 67 yo M with hx of R TKR done 2009.  He has done well until having a dental filing ~ 2 weeks ago without prophylaxis. He developed sudden onset of knee pain on R. He was seen in his orthopedists' office and had an aspiration performed. Cell count was not done due to too much mucous. 99% Neutrophils. No crystals. The Cx from this has grown viridans strep. He was started on oral antibiotics.  He presents today and has undergone resection of his TKR.   Past Medical History  Diagnosis Date  . Hypertension   . Heart murmur   . Sleep apnea     cpap  . GERD (gastroesophageal reflux disease)   . Cancer     melanoma    Past Surgical History  Procedure Laterality Date  . Joint replacement Right   . Eye surgery      left eye  . Arthroscopic knee Left      No Known Allergies  Medications:  Scheduled: . ceFAZolin  1 g Other To OR  . cloNIDine  150 mcg Intra-articular Once  . nafcillin  1 g Intramuscular Once    Total days of antibiotics 0          Social History:  reports that he has quit smoking. He does not have any smokeless  tobacco history on file. He reports that  drinks alcohol. He reports that he does not use illicit drugs.  History reviewed. No pertinent family history.  General ROS: unobtainable. pt seen post-op.   Blood pressure 152/78, pulse 86, temperature 99.4 F (37.4 C), temperature source Oral, resp. rate 18, height 5' 9.5" (1.765 m), weight 131.543 kg (290 lb), SpO2 96.00%. General appearance: delirious and distracted Eyes: negative findings: pt won't cooperate with exam.  Throat: pt won't cooperate with exam Lungs: clear to auscultation bilaterally Heart: regular rate and rhythm Abdomen: normal findings: soft, non-tender and abnormal findings:  hypoactive bowel sounds Extremities: RLE wrapped. toes warm. LLE no lesions.    Results for orders placed during the hospital encounter of 02/23/13 (from the past 48 hour(s))  CBC     Status: Abnormal   Collection Time    02/23/13  2:04 PM      Result Value Range   WBC 16.4 (*) 4.0 - 10.5 K/uL   RBC 3.89 (*) 4.22 - 5.81 MIL/uL   Hemoglobin 12.7 (*) 13.0 - 17.0 g/dL   HCT 98.1 (*) 19.1 - 47.8 %   MCV 91.8  78.0 - 100.0 fL   MCH 32.6  26.0 - 34.0 pg   MCHC 35.6  30.0 - 36.0 g/dL   RDW 29.5  62.1 -  15.5 %   Platelets 231  150 - 400 K/uL  BASIC METABOLIC PANEL     Status: Abnormal   Collection Time    02/23/13  2:04 PM      Result Value Range   Sodium 130 (*) 135 - 145 mEq/L   Potassium 4.5  3.5 - 5.1 mEq/L   Chloride 94 (*) 96 - 112 mEq/L   CO2 23  19 - 32 mEq/L   Glucose, Bld 108 (*) 70 - 99 mg/dL   BUN 19  6 - 23 mg/dL   Creatinine, Ser 4.09  0.50 - 1.35 mg/dL   Calcium 81.1 (*) 8.4 - 10.5 mg/dL   GFR calc non Af Amer >90  >90 mL/min   GFR calc Af Amer >90  >90 mL/min   Comment: (NOTE)     The eGFR has been calculated using the CKD EPI equation.     This calculation has not been validated in all clinical situations.     eGFR's persistently <90 mL/min signify possible Chronic Kidney     Disease.   No results found for this basename:  sdes, specrequest, cult, reptstatus   Dg Chest 2 View  02/23/2013   *RADIOLOGY REPORT*  Clinical Data: Hypertension.  Preoperative for knee surgery  CHEST - 2 VIEW  Comparison: February 01, 2008  Findings: The lung volumes are low. There is chronic elevation right hemidiaphragm.  There is no focal infiltrate, pulmonary edema, or pleural effusion.  The aorta is uncoiled.  The heart size is upper limits of normal.  The soft tissues and osseous structures are stable.  IMPRESSION: No acute cardiopulmonary disease identified.   Original Report Authenticated By: Sherian Rein, M.D.   No results found for this or any previous visit (from the past 240 hour(s)).    02/23/2013, 7:31 PM     LOS: 0 days

## 2013-02-24 ENCOUNTER — Encounter (HOSPITAL_COMMUNITY): Payer: Self-pay | Admitting: Infectious Diseases

## 2013-02-24 LAB — BASIC METABOLIC PANEL
BUN: 23 mg/dL (ref 6–23)
CO2: 25 mEq/L (ref 19–32)
Chloride: 94 mEq/L — ABNORMAL LOW (ref 96–112)
Creatinine, Ser: 1.15 mg/dL (ref 0.50–1.35)
GFR calc Af Amer: 74 mL/min — ABNORMAL LOW (ref >90)
Potassium: 4.4 mEq/L (ref 3.5–5.1)

## 2013-02-24 LAB — CBC
HCT: 30.1 % — ABNORMAL LOW (ref 39.0–52.0)
Hemoglobin: 10.1 g/dL — ABNORMAL LOW (ref 13.0–17.0)
MCV: 92.3 fL (ref 78.0–100.0)
RBC: 3.26 MIL/uL — ABNORMAL LOW (ref 4.22–5.81)
RDW: 13.3 % (ref 11.5–15.5)
WBC: 15.7 10*3/uL — ABNORMAL HIGH (ref 4.0–10.5)

## 2013-02-24 LAB — PROTIME-INR: INR: 1.15 (ref 0.00–1.49)

## 2013-02-24 MED ORDER — METHOCARBAMOL 500 MG PO TABS: 500.0000 mg | ORAL_TABLET | Freq: Four times a day (QID) | ORAL | Status: DC | PRN

## 2013-02-24 MED ORDER — SODIUM CHLORIDE 0.9 % IJ SOLN
10.0000 mL | INTRAMUSCULAR | Status: DC | PRN
  Administered 2013-02-25: 20 mL
  Administered 2013-02-26 (×2): 10 mL

## 2013-02-24 MED ORDER — DEXTROSE 5 % IV SOLN: 2.0000 g | INTRAVENOUS | Status: DC

## 2013-02-24 MED ORDER — OXYCODONE HCL 5 MG PO TABS: 5.0000 mg | ORAL_TABLET | ORAL | Status: DC | PRN

## 2013-02-24 MED ORDER — WARFARIN SODIUM 7.5 MG PO TABS
7.5000 mg | ORAL_TABLET | Freq: Once | ORAL | Status: AC
  Administered 2013-02-24: 7.5 mg via ORAL
  Filled 2013-02-24: qty 1

## 2013-02-24 MED ORDER — COUMADIN BOOK
Freq: Once | Status: AC
  Administered 2013-02-24: 14:00:00
  Filled 2013-02-24: qty 1

## 2013-02-24 MED ORDER — WARFARIN VIDEO: Freq: Once | Status: DC

## 2013-02-24 MED ORDER — WARFARIN SODIUM 5 MG PO TABS: 5.0000 mg | ORAL_TABLET | Freq: Every day | ORAL | Status: DC

## 2013-02-24 NOTE — Evaluation (Signed)
Physical Therapy Evaluation Patient Details Name: Casey Donaldson. MRN: [id number] DOB: 07-27-1945 Today's Date: 02/24/2013 Time: 4098-1191 PT Time Calculation (min): 28 min  PT Assessment / Plan / Recommendation History of Present Illness  Pt. underwent R sx secondary to infection.  Clinical Impression  Pt is s/p Rt TKA with antibiotic spacer resulting in the deficits listed below (see PT Problem List).  Pt will benefit from skilled PT to increase their independence and safety with mobility to allow discharge to the venue listed below. Pt requires 2+ (A) at this time and would benefit from STSNF prior to returning home with wife.      PT Assessment  Patient needs continued PT services    Follow Up Recommendations  SNF;Supervision/Assistance - 24 hour    Does the patient have the potential to tolerate intense rehabilitation      Barriers to Discharge Decreased caregiver support      Equipment Recommendations  Other (comment) (TBD at SNF)    Recommendations for Other Services     Frequency 7X/week    Precautions / Restrictions Precautions Precautions: Fall;Knee Required Braces or Orthoses: Knee Immobilizer - Right Knee Immobilizer - Right: Other (comment) (tilll D/C by MD) Restrictions Weight Bearing Restrictions: Yes RLE Weight Bearing: Partial weight bearing RLE Partial Weight Bearing Percentage or Pounds: 50   Pertinent Vitals/Pain 9.5/10; pt premedicated       Mobility  Bed Mobility Bed Mobility: Supine to Sit;Sitting - Scoot to Edge of Bed Supine to Sit: 3: Mod assist;With rails;HOB flat Sitting - Scoot to Edge of Bed: 4: Min assist;With rail Details for Bed Mobility Assistance: HOB flattened to simulate home enviroment; pt requires increased (A) to advance Rt LE to/off EOB and (A) from second person to bring trunk to upright sititng position; multimodal cues for hand placement and sequencing; relies heavily on handrails  Transfers Transfers: Sit to  Stand;Stand to Sit Sit to Stand: 1: +2 Total assist;From elevated surface;From bed;With upper extremity assist Sit to Stand: Patient Percentage: 20% Stand to Sit: 1: +2 Total assist;With upper extremity assist;To chair/3-in-1;With armrests Stand to Sit: Patient Percentage: 20% Details for Transfer Assistance: pt with incr difficulty achieving upright standing position; requires 2 attempts to complete transfer with max cues for hand placement and sequencing; pt requires 2+ for safety and pt has difficulty maintaining PWB status on Rt LE . Pt requires incr time due to pain and fear of pain  Ambulation/Gait Ambulation/Gait Assistance: 1: +2 Total assist Ambulation/Gait: Patient Percentage: 30% Ambulation Distance (Feet): 4 Feet Assistive device: Rolling walker Ambulation/Gait Assistance Details: pt requires facilitation to advance Rt LE; pt unable to fully advance Lt LE with step, slides Lt foot foward due to pain and PWB status on Rt LE; max cues for sequencing and safety with RW  Gait Pattern: Step-to pattern;Trunk flexed (slides Lt foot forward ) Gait velocity: decr Stairs: No Wheelchair Mobility Wheelchair Mobility: No    Exercises Total Joint Exercises Ankle Circles/Pumps: Both;10 reps;Seated   PT Diagnosis: Difficulty walking;Acute pain  PT Problem List: Decreased strength;Decreased range of motion;Decreased activity tolerance;Decreased balance;Decreased mobility;Decreased knowledge of use of DME;Obesity;Pain PT Treatment Interventions: DME instruction;Gait training;Functional mobility training;Therapeutic activities;Therapeutic exercise;Balance training;Neuromuscular re-education;Patient/family education     PT Goals(Current goals can be found in the care plan section) Acute Rehab PT Goals Patient Stated Goal: go home but is agreeable to rehab PT Goal Formulation: With patient Time For Goal Achievement: 03/03/13 Potential to Achieve Goals: Fair  Visit Information  Last PT  Received  On: 02/24/13 Assistance Needed: +2 PT/OT Co-Evaluation/Treatment: Yes History of Present Illness: Pt. underwent R sx secondary to infection.       Prior Functioning  Home Living Family/patient expects to be discharged to:: Skilled nursing facility Living Arrangements: Spouse/significant other Available Help at Discharge: Family;Available 24 hours/day Type of Home: House Home Equipment: Walker - 2 wheels;Shower seat;Grab bars - toilet (lift chair) Additional Comments: Pt has 2 steps to enter house with handrails  Prior Function Level of Independence: Independent Comments: pt was independent prior to 1 week ago when pain increased and pt was 2+ (A)  Communication Communication: No difficulties Dominant Hand: Right    Cognition  Cognition Arousal/Alertness: Awake/alert Behavior During Therapy: WFL for tasks assessed/performed Overall Cognitive Status: Within Functional Limits for tasks assessed    Extremity/Trunk Assessment Upper Extremity Assessment Upper Extremity Assessment: Defer to OT evaluation Lower Extremity Assessment Lower Extremity Assessment: RLE deficits/detail RLE Deficits / Details: ankle WFL  RLE: Unable to fully assess due to pain;Unable to fully assess due to immobilization Cervical / Trunk Assessment Cervical / Trunk Assessment: Kyphotic   Balance Balance Balance Assessed: Yes Static Sitting Balance Static Sitting - Balance Support: Bilateral upper extremity supported;Feet supported Static Sitting - Level of Assistance: 5: Stand by assistance Static Sitting - Comment/# of Minutes: tolerated sitting EOB ~4 min  End of Session PT - End of Session Equipment Utilized During Treatment: Gait belt;Right knee immobilizer Activity Tolerance: Patient limited by pain Patient left: in chair;with call bell/phone within reach;with family/visitor present Nurse Communication: Mobility status;Other (comment) (O2 stat)  GP     Donell Sievert,  [place] [phone] 02/24/2013, 10:52 AM

## 2013-02-24 NOTE — Evaluation (Signed)
Occupational Therapy Evaluation Patient Details Name: Casey Donaldson. MRN: [id number] DOB: 29-Aug-1945 Today's Date: 02/24/2013 Time: 4098-1191 OT Time Calculation (min): 37 min  OT Assessment / Plan / Recommendation History of present illness Pt. underwent R sx secondary to infection.   Clinical Impression   Pt. Underwent R knee sx to remove hardware from previous knee replacement. Pt. Had antibiotic spacers placed and pt. Is now 50% WB. Pt. Is currently requiring Total A of 2 with pt. Performing 20% of sit to stand and stand pivot. Pt. Will have 24/7 assist at home but they can not provide this much physical assist. Pt. Would benefit from ST SNF placement for rehab and family is an agreement. Pt. To be followed by acute OT to ed. Pt. On AE and DME options. Pt. May d/c home if mobility status greatly improved prior to hospital d/c.     OT Assessment  Patient needs continued OT Services    Follow Up Recommendations  SNF    Barriers to Discharge  (Pt. requires physical assist that is not available)    Equipment Recommendations  None recommended by OT    Recommendations for Other Services    Frequency  Min 2X/week    Precautions / Restrictions Precautions Required Braces or Orthoses: Knee Immobilizer - Right Restrictions RLE Weight Bearing: Partial weight bearing   Pertinent Vitals/Pain     ADL  Eating/Feeding: Simulated;Independent Grooming: Simulated;Set up;Brushing hair Upper Body Bathing: Simulated;Set up Lower Body Bathing: Simulated;Maximal assistance Where Assessed - Lower Body Bathing: Unsupported sitting Upper Body Dressing: Performed;Minimal assistance Where Assessed - Upper Body Dressing: Unsupported sitting Lower Body Dressing: Performed;+2 Total assistance Lower Body Dressing: Patient Percentage: 10% Where Assessed - Lower Body Dressing: Unsupported sitting;Supported standing Toilet Transfer: Simulated;+2 Total assistance Toilet Transfer: Patient  Percentage: 20% Toilet Transfer Method: Stand pivot Transfers/Ambulation Related to ADLs: Pt. Total A of 2 pt. 20% with sit to stand from bed and stand pivot. ADL Comments: Pt. will need AEadn DME education.     OT Diagnosis: Generalized weakness;Acute pain  OT Problem List: Decreased activity tolerance;Impaired balance (sitting and/or standing);Decreased knowledge of use of DME or AE;Decreased knowledge of precautions OT Treatment Interventions: Self-care/ADL training;DME and/or AE instruction;Therapeutic activities   OT Goals(Current goals can be found in the care plan section) Acute Rehab OT Goals Patient Stated Goal: go home but is agreeable to rehab OT Goal Formulation: With patient Time For Goal Achievement: 03/10/13 Potential to Achieve Goals: Good ADL Goals Pt Will Perform Lower Body Dressing: with min assist Pt Will Transfer to Toilet: with min assist Pt Will Perform Toileting - Clothing Manipulation and hygiene: with min assist Pt Will Perform Tub/Shower Transfer: with min assist  Visit Information  Last OT Received On: 02/24/13 Assistance Needed: +2 History of Present Illness: Pt. underwent R sx secondary to infection.       Prior Functioning     Home Living Family/patient expects to be discharged to:: Skilled nursing facility Living Arrangements: Spouse/significant other Available Help at Discharge: Family;Available 24 hours/day Type of Home: House Home Equipment: Walker - 2 wheels;Shower seat;Grab bars - toilet Prior Function Level of Independence: Independent Communication Communication: No difficulties         Vision/Perception Vision - History Baseline Vision: Wears glasses only for reading   Cognition  Cognition Arousal/Alertness: Awake/alert Behavior During Therapy: WFL for tasks assessed/performed Overall Cognitive Status: Within Functional Limits for tasks assessed    Extremity/Trunk Assessment Upper Extremity Assessment Upper Extremity  Assessment: Overall WFL for  tasks assessed     Mobility       Exercise     Balance     End of Session OT - End of Session Equipment Utilized During Treatment: Gait belt;Rolling walker;Right knee immobilizer Activity Tolerance: Patient limited by pain Patient left: in chair;with call bell/phone within reach;with family/visitor present Nurse Communication: Mobility status  GO     Elizandro Laura 02/24/2013, 10:25 AM

## 2013-02-24 NOTE — Discharge Summary (Signed)
Physician Discharge Summary  Patient ID: Casey Donaldson. MRN: [id number] DOB/AGE: [birth year] 67 y.o.  Admit date: 02/23/2013 Discharge date: 02/26/2013 Admission Diagnoses:  Right TKA infection  Discharge Diagnoses:  Same  Surgeries: Procedure(s): EXCISIONAL TOTAL KNEE ARTHROPLASTY WITH ANTIBIOTIC SPACERS on 02/23/2013   Consultants:    Discharged Condition: Stable  Hospital Course: Casey Donaldson. is an 67 y.o. male who was admitted 02/23/2013 with a chief complaint of right tka infection, and found to have a diagnosis of knee infection.  They were brought to the operating room on 02/23/2013 and underwent the above named procedures. Organism strep - id consult 6 weeks ceftriaxone - 2weeks abx free with re culture before reimplant. DC to snf Friday tdwb.   Antibiotics given:  Anti-infectives   Start     Dose/Rate Route Frequency Ordered Stop   02/24/13 0000  dextrose 5 % SOLN 50 mL with cefTRIAXone 2 G SOLR 2 g     2 g 100 mL/hr over 30 Minutes Intravenous Every 24 hours 02/24/13 1614     02/23/13 2215  ceFAZolin (ANCEF) IVPB 2 g/50 mL premix  Status:  Discontinued     2 g 100 mL/hr over 30 Minutes Intravenous Every 6 hours 02/23/13 2214 02/24/13 1445   02/23/13 2100  cefTRIAXone (ROCEPHIN) 2 g in dextrose 5 % 50 mL IVPB     2 g 100 mL/hr over 30 Minutes Intravenous Every 24 hours 02/23/13 2048 04/06/13 2059   02/23/13 1917  nafcillin injection  Status:  Discontinued       As needed 02/23/13 1917 02/23/13 2025   02/23/13 1917  ceFAZolin (ANCEF) powder  Status:  Discontinued       As needed 02/23/13 1936 02/23/13 2025   02/23/13 1900  ceFAZolin (ANCEF) powder 1 g     1 g Other To Surgery 02/23/13 1845 02/24/13 1900   02/23/13 1900  nafcillin injection 1 g     1 g Intramuscular  Once 02/23/13 1845     02/23/13 1638  vancomycin (VANCOCIN) powder  Status:  Discontinued       As needed 02/23/13 1638 02/23/13 2025   02/23/13 1615  vancomycin (VANCOCIN) 1,500 mg in  sodium chloride 0.9 % 500 mL IVPB     1,500 mg 250 mL/hr over 120 Minutes Intravenous  Once 02/23/13 1605 02/23/13 1621   02/23/13 1600  ceFAZolin (ANCEF) powder 1 g     1 g Other To Surgery 02/23/13 1556 02/23/13 1616   02/23/13 1600  nafcillin injection 1 g     1 g Intravenous To Surgery 02/23/13 1556 02/23/13 1616    .  Recent vital signs:  Filed Vitals:   02/24/13 1233  BP: 154/55  Pulse: 76  Temp: 99.7 F (37.6 C)  Resp: 16    Recent laboratory studies:  Results for orders placed during the hospital encounter of 02/23/13  BODY FLUID CULTURE      Result Value Range   Specimen Description FLUID RIGHT KNEE     Special Requests FLUID ON SWAB PT ON KEFLEX CULTUES 1     Gram Stain       Value: ABUNDANT WBC PRESENT, PREDOMINANTLY PMN     FEW GRAM POSITIVE COCCI     IN PAIRS Gram Stain Report Called to,Read Back By and Verified With: Gram Stain Report Called to,Read Back By and Verified With: Casey Donaldson 02/24/13 7:28AM BY MILSH     Performed at [facility]  Value: NO GROWTH 1 DAY     Performed at [facility]   Report Status PENDING    BODY FLUID CULTURE      Result Value Range   Specimen Description FLUID RIGHT KNEE     Special Requests  RIGHT KNEE JOINT FLUID PT ON KEFLEX CULTURES 2     Gram Stain       Value: ABUNDANT WBC PRESENT,BOTH PMN AND MONONUCLEAR     RARE GRAM POSITIVE COCCI     IN PAIRS     Performed at [facility]   Culture       Value: NO GROWTH 1 DAY     Performed at [facility]   Report Status PENDING    ANAEROBIC CULTURE      Result Value Range   Specimen Description FLUID RIGHT KNEE     Special Requests FLUID ON SWAB PT ON KEFLEX CUTURES 1     Gram Stain       Value: ABUNDANT WBC PRESENT, PREDOMINANTLY PMN     FEW GRAM POSITIVE COCCI     IN PAIRS     Performed at [facility]   Culture       Value: NO ANAEROBES ISOLATED; CULTURE IN PROGRESS FOR 5 DAYS     Performed at [facility]   Report Status PENDING    ANAEROBIC CULTURE      Result Value Range   Specimen Description FLUID RIGHT KNEE     Special Requests FLUID ON SWAB PT ON KEFLEX CULTURES 2     Gram Stain       Value: ABUNDANT WBC PRESENT,BOTH PMN AND MONONUCLEAR     FEW GRAM POSITIVE COCCI     IN PAIRS     Performed at [facility]   Culture       Value: NO ANAEROBES ISOLATED; CULTURE IN PROGRESS FOR 5 DAYS     Performed at [facility]   Report Status PENDING    CBC      Result Value Range   WBC 16.4 (*) 4.0 - 10.5 K/uL   RBC 3.89 (*) 4.22 - 5.81 MIL/uL   Hemoglobin 12.7 (*) 13.0 - 17.0 g/dL   HCT 16.1 (*) 09.6 - 04.5 %   MCV 91.8  78.0 - 100.0 fL   MCH 32.6  26.0 - 34.0 pg   MCHC 35.6  30.0 - 36.0 g/dL   RDW 40.9  81.1 - 91.4 %   Platelets 231  150 - 400 K/uL  BASIC METABOLIC PANEL      Result Value Range   Sodium 130 (*) 135 - 145 mEq/L   Potassium 4.5  3.5 - 5.1 mEq/L   Chloride 94 (*) 96 - 112 mEq/L   CO2 23  19 - 32 mEq/L   Glucose, Bld 108 (*) 70 - 99 mg/dL   BUN 19  6 - 23 mg/dL   Creatinine, Ser 7.82  0.50 - 1.35 mg/dL   Calcium 95.6 (*) 8.4 - 10.5 mg/dL   GFR calc non Af Amer >90  >90 mL/min   GFR calc Af Amer >90  >90 mL/min  PROTIME-INR      Result Value Range   Prothrombin Time 14.5  11.6 - 15.2 seconds   INR 1.15  0.00 - 1.49  CBC      Result Value Range   WBC 15.7 (*) 4.0 - 10.5 K/uL   RBC 3.26 (*) 4.22 - 5.81 MIL/uL  Hemoglobin 10.1 (*) 13.0 - 17.0 g/dL   HCT 45.4 (*) 09.8 - 11.9 %   MCV 92.3  78.0 - 100.0 fL   MCH 31.0  26.0 - 34.0 pg   MCHC 33.6  30.0 - 36.0 g/dL   RDW 14.7  82.9 - 56.2 %   Platelets 240  150 - 400 K/uL  BASIC METABOLIC PANEL      Result Value Range   Sodium 128 (*) 135 - 145 mEq/L   Potassium 4.4  3.5 - 5.1 mEq/L   Chloride 94 (*) 96 - 112 mEq/L   CO2 25  19 - 32 mEq/L   Glucose, Bld 141 (*) 70 - 99 mg/dL   BUN 23  6 - 23 mg/dL   Creatinine, Ser 1.30  0.50 - 1.35 mg/dL   Calcium 86.5  8.4 - 78.4 mg/dL   GFR calc  non Af Amer 64 (*) >90 mL/min   GFR calc Af Amer 74 (*) >90 mL/min  PROTIME-INR      Result Value Range   Prothrombin Time 14.3  11.6 - 15.2 seconds   INR 1.13  0.00 - 1.49    Discharge Medications:     Medication List    STOP taking these medications       ALEVE 220 MG tablet  Generic drug:  naproxen sodium     aspirin EC 81 MG tablet     cephALEXin 500 MG capsule  Commonly known as:  KEFLEX     HYDROcodone-acetaminophen 10-325 MG per tablet  Commonly known as:  NORCO      TAKE these medications       dextrose 5 % SOLN 50 mL with cefTRIAXone 2 G SOLR 2 g  Inject 2 g into the vein daily.     enalapril 20 MG tablet  Commonly known as:  VASOTEC  Take 20 mg by mouth 2 (two) times daily.     felodipine 5 MG 24 hr tablet  Commonly known as:  PLENDIL  Take 5 mg by mouth every morning.     fluticasone 50 MCG/ACT nasal spray  Commonly known as:  FLONASE  Place 2 sprays into both nostrils at bedtime.     methocarbamol 500 MG tablet  Commonly known as:  ROBAXIN  Take 1 tablet (500 mg total) by mouth every 6 (six) hours as needed.     oxyCODONE 5 MG immediate release tablet  Commonly known as:  Oxy IR/ROXICODONE  Take 1-2 tablets (5-10 mg total) by mouth every 3 (three) hours as needed.     rosuvastatin 20 MG tablet  Commonly known as:  CRESTOR  Take 20 mg by mouth at bedtime.     warfarin 5 MG tablet  Commonly known as:  COUMADIN  Take 1 tablet (5 mg total) by mouth daily.        Diagnostic Studies: Dg Chest 2 View  02/23/2013   *RADIOLOGY REPORT*  Clinical Data: Hypertension.  Preoperative for knee surgery  CHEST - 2 VIEW  Comparison: February 01, 2008  Findings: The lung volumes are low. There is chronic elevation right hemidiaphragm.  There is no focal infiltrate, pulmonary edema, or pleural effusion.  The aorta is uncoiled.  The heart size is upper limits of normal.  The soft tissues and osseous structures are stable.  IMPRESSION: No acute cardiopulmonary disease  identified.   Original Report Authenticated By: Sherian Rein, M.D.    Disposition:       Discharge Orders   Future  Orders Complete By Expires   Call MD / Call 911  As directed    Comments:     If you experience chest pain or shortness of breath, CALL 911 and be transported to the hospital emergency room.  If you develope a fever above 101 F, pus (white drainage) or increased drainage or redness at the wound, or calf pain, call your surgeon's office.   Constipation Prevention  As directed    Comments:     Drink plenty of fluids.  Prune juice may be helpful.  You may use a stool softener, such as Colace (over the counter) 100 mg twice a day.  Use MiraLax (over the counter) for constipation as needed.   Diet - low sodium heart healthy  As directed    Discharge instructions  As directed    Comments:     Touch down weight bearing right leg Keep incision dry Ok for range of motion as tolerated   Increase activity slowly as tolerated  As directed    Touch down weight bearing  As directed          Signed: Deron Poole SCOTT 02/24/2013, 4:14 PM

## 2013-02-24 NOTE — Progress Notes (Signed)
Utilization review completed.  

## 2013-02-24 NOTE — Progress Notes (Signed)
Patient placed on auto CPAP at this time. Tolerating well, RT will continue to monitor. 

## 2013-02-24 NOTE — Progress Notes (Signed)
Plan snf Friday Strep treatable rx on chart Summary done

## 2013-02-24 NOTE — Progress Notes (Signed)
INFECTIOUS DISEASE PROGRESS NOTE  ID: Casey Donaldson. is a 67 y.o. male with  Active Problems:   * No active hospital problems. *  Subjective:  67 yo M with hx of R TKR 01-2008. His more recent hx is notable for dental cleaning 8-27 with peri-procedure anbx. On 8-29 he developed temps, increased swelling in his R leg after twisting his leg. On 9-3 he was again seen in dental office and had a filling replaced (no anbx). He developed severe knee swelling after this and needed help getting out of his car. He had worsening swelling of his leg and was seen by orthopedics on 9-5. He had 50 cc of bloody/white fluid removed. He was started on keflex 500mg  qid. His Cx grew Strep viridans.  Today complains of knee pain.   Abtx:  Anti-infectives   Start     Dose/Rate Route Frequency Ordered Stop   02/23/13 2215  ceFAZolin (ANCEF) IVPB 2 g/50 mL premix  Status:  Discontinued     2 g 100 mL/hr over 30 Minutes Intravenous Every 6 hours 02/23/13 2214 02/24/13 1445   02/23/13 2100  cefTRIAXone (ROCEPHIN) 2 g in dextrose 5 % 50 mL IVPB     2 g 100 mL/hr over 30 Minutes Intravenous Every 24 hours 02/23/13 2048 04/06/13 2059   02/23/13 1917  nafcillin injection  Status:  Discontinued       As needed 02/23/13 1917 02/23/13 2025   02/23/13 1917  ceFAZolin (ANCEF) powder  Status:  Discontinued       As needed 02/23/13 1936 02/23/13 2025   02/23/13 1900  ceFAZolin (ANCEF) powder 1 g     1 g Other To Surgery 02/23/13 1845 02/24/13 1900   02/23/13 1900  nafcillin injection 1 g     1 g Intramuscular  Once 02/23/13 1845     02/23/13 1638  vancomycin (VANCOCIN) powder  Status:  Discontinued       As needed 02/23/13 1638 02/23/13 2025   02/23/13 1615  vancomycin (VANCOCIN) 1,500 mg in sodium chloride 0.9 % 500 mL IVPB     1,500 mg 250 mL/hr over 120 Minutes Intravenous  Once 02/23/13 1605 02/23/13 1621   02/23/13 1600  ceFAZolin (ANCEF) powder 1 g     1 g Other To Surgery 02/23/13 1556 02/23/13 1616     02/23/13 1600  nafcillin injection 1 g     1 g Intravenous To Surgery 02/23/13 1556 02/23/13 1616      Medications:  Scheduled: . atorvastatin  10 mg Oral q1800  . ceFAZolin  1 g Other To OR  . cefTRIAXone (ROCEPHIN)  IV  2 g Intravenous Q24H  . enalapril  20 mg Oral BID  . felodipine  5 mg Oral q morning - 10a  . fluticasone  2 spray Each Nare QHS  . nafcillin  1 g Intramuscular Once  . warfarin  7.5 mg Oral ONCE-1800  . warfarin   Does not apply Once  . Warfarin - Pharmacist Dosing Inpatient   Does not apply q1800    Objective: Vital signs in last 24 hours: Temp:  [97.1 F (36.2 C)-99.7 F (37.6 C)] 99.7 F (37.6 C) (09/10 1233) Pulse Rate:  [76-99] 76 (09/10 1233) Resp:  [13-22] 16 (09/10 1233) BP: (129-165)/(44-64) 154/55 mmHg (09/10 1233) SpO2:  [9 %-98 %] 91 % (09/10 1233)   General appearance: alert, cooperative and no distress Eyes: negative findings: pupils equal, round, reactive to light and accomodation Throat: normal  findings: oropharynx pink & moist without lesions or evidence of thrush Resp: clear to auscultation bilaterally Cardio: regular rate and rhythm GI: normal findings: bowel sounds normal and soft, non-tender Extremities: RLE wrapped.   Lab Results  Recent Labs  02/23/13 1404 02/24/13 0519  WBC 16.4* 15.7*  HGB 12.7* 10.1*  HCT 35.7* 30.1*  NA 130* 128*  K 4.5 4.4  CL 94* 94*  CO2 23 25  BUN 19 23  CREATININE 0.76 1.15   Liver Panel No results found for this basename: PROT, ALBUMIN, AST, ALT, ALKPHOS, BILITOT, BILIDIR, IBILI,  in the last 72 hours Sedimentation Rate No results found for this basename: ESRSEDRATE,  in the last 72 hours C-Reactive Protein No results found for this basename: CRP,  in the last 72 hours  Microbiology: Recent Results (from the past 240 hour(s))  BODY FLUID CULTURE     Status: None   Collection Time    02/23/13  4:19 PM      Result Value Range Status   Specimen Description FLUID RIGHT KNEE   Final    Special Requests FLUID ON SWAB PT ON KEFLEX CULTUES 1   Final   Gram Stain     Final   Value: ABUNDANT WBC PRESENT, PREDOMINANTLY PMN     FEW GRAM POSITIVE COCCI     IN PAIRS Gram Stain Report Called to,Read Back By and Verified With: Gram Stain Report Called to,Read Back By and Verified With: JUSTIN WARREN 02/24/13 7:28AM BY MILSH     Performed at [facility]   Culture     Final   Value: NO GROWTH 1 DAY     Performed at [facility]   Report Status PENDING   Incomplete  ANAEROBIC CULTURE     Status: None   Collection Time    02/23/13  4:19 PM      Result Value Range Status   Specimen Description FLUID RIGHT KNEE   Final   Special Requests FLUID ON SWAB PT ON KEFLEX CUTURES 1   Final   Gram Stain     Final   Value: ABUNDANT WBC PRESENT, PREDOMINANTLY PMN     FEW GRAM POSITIVE COCCI     IN PAIRS     Performed at [facility]   Culture     Final   Value: NO ANAEROBES ISOLATED; CULTURE IN PROGRESS FOR 5 DAYS     Performed at [facility]   Report Status PENDING   Incomplete  BODY FLUID CULTURE     Status: None   Collection Time    02/23/13  4:22 PM      Result Value Range Status   Specimen Description FLUID RIGHT KNEE   Final   Special Requests  RIGHT KNEE JOINT FLUID PT ON KEFLEX CULTURES 2   Final   Gram Stain     Final   Value: ABUNDANT WBC PRESENT,BOTH PMN AND MONONUCLEAR     RARE GRAM POSITIVE COCCI     IN PAIRS     Performed at [facility]   Culture     Final   Value: NO GROWTH 1 DAY     Performed at [facility]   Report Status PENDING   Incomplete  ANAEROBIC CULTURE     Status: None   Collection Time    02/23/13  4:22 PM      Result Value Range Status   Specimen Description FLUID RIGHT KNEE   Final  Special Requests FLUID ON SWAB PT ON KEFLEX CULTURES 2   Final   Gram Stain     Final   Value: ABUNDANT WBC PRESENT,BOTH PMN AND MONONUCLEAR     FEW GRAM POSITIVE COCCI     IN PAIRS     Performed at [facility]   Culture     Final   Value: NO ANAEROBES ISOLATED; CULTURE IN PROGRESS FOR 5 DAYS     Performed at [facility]   Report Status PENDING   Incomplete    Studies/Results: Dg Chest 2 View  02/23/2013   *RADIOLOGY REPORT*  Clinical Data: Hypertension.  Preoperative for knee surgery  CHEST - 2 VIEW  Comparison: February 01, 2008  Findings: The lung volumes are low. There is chronic elevation right hemidiaphragm.  There is no focal infiltrate, pulmonary edema, or pleural effusion.  The aorta is uncoiled.  The heart size is upper limits of normal.  The soft tissues and osseous structures are stable.  IMPRESSION: No acute cardiopulmonary disease identified.   Original Report Authenticated By: Sherian Rein, M.D.     Assessment/Plan: Osteomyelitis R knee S/p resection of infected TKR  Will place PIC Await Cx from OR, suspect that this will be the same as his Cx from outpt.  Will continue him on ceftriaxone alone, stop ancef My great appreciation to pharmacy.   Total days of antibiotics: 2 (ceftriaxone)         Johny Sax Infectious Diseases (pager) [phone] [email or web address] 02/24/2013, 4:00 PM  LOS: 1 day

## 2013-02-24 NOTE — Progress Notes (Signed)
ANTICOAGULATION CONSULT NOTE - Follow Up  Pharmacy Consult for Coumadin Indication: VTE prophylaxis  No Known Allergies Patient Measurements: Height: 5' 9.5" (176.5 cm) Weight: 290 lb (131.543 kg) IBW/kg (Calculated) : 71.85 Vital Signs: Temp: 99.7 F (37.6 C) (09/10 1233) BP: 154/55 mmHg (09/10 1233) Pulse Rate: 76 (09/10 1233) Labs:  Recent Labs  02/23/13 1404 02/23/13 2321 02/24/13 0519  HGB 12.7*  --  10.1*  HCT 35.7*  --  30.1*  PLT 231  --  240  LABPROT  --  14.3 14.5  INR  --  1.13 1.15  CREATININE 0.76  --  1.15   Estimated Creatinine Clearance: 86.7 ml/min (by C-G formula based on Cr of 1.15).  Medical History: Past Medical History  Diagnosis Date  . Hypertension   . Heart murmur   . Sleep apnea     cpap  . GERD (gastroesophageal reflux disease)   . Cancer     melanoma   Assessment: 66 YOM status post TKA to start Coumadin for VTE prophylaxis. Patient was only on aspirin prior to admission and has no history of liver dysfunction. Baseline CBC wnl. Patient is on Ancef and Rocephin for strep infection of the knee.  He had implantation of antibiotic impregnated cement spacer with Vancomycin, Nafcillin and Cefazolin.  He has some mild leukocytosis today with WBC of 15.7K.  Medication Review:  No new additional medications added that would interfere with the action of Warfarin therapy.  Education:  Will provide patient with teaching book and order Warfarin video for patient to review today.  Will plan face to face education prior to discharge.  Goal of Therapy:  INR 2-3   Plan:  1. Coumadin 7.5mg  po x1 tonight   2. Daily PT/INR  Nadara Mustard, PharmD., MS Clinical Pharmacist Pager:  [phone] Thank you for allowing pharmacy to be part of this patients care team. 02/24/2013,2:04 PM

## 2013-02-24 NOTE — Progress Notes (Signed)
Foley was discontinued this morning and patient unable to void after 6 hours. Patient was bladder scanned and 508cc was found. Patient was in and out catheterized and 650cc was recorded out. Will continue to monitor.

## 2013-02-24 NOTE — Progress Notes (Signed)
Peripherally Inserted Central Catheter/Midline Placement  The IV Nurse has discussed with the patient and/or persons authorized to consent for the patient, the purpose of this procedure and the potential benefits and risks involved with this procedure.  The benefits include less needle sticks, lab draws from the catheter and patient may be discharged home with the catheter.  Risks include, but not limited to, infection, bleeding, blood clot (thrombus formation), and puncture of an artery; nerve damage and irregular heat beat.  Alternatives to this procedure were also discussed.  PICC/Midline Placement Documentation  PICC / Midline Single Lumen 02/24/13 PICC Left Brachial (Active)  Dressing Change Due 03/03/13 02/24/2013  5:00 PM       Stacie Glaze Horton 02/24/2013, 5:58 PM

## 2013-02-24 NOTE — Progress Notes (Signed)
Patient unable to void after six hours. Patient was bladder scanned and 345cc was found in bladder. Patient was in and out catheterized and 700cc of urine was recorded for output. Nursing will continue to monitor.

## 2013-02-24 NOTE — Progress Notes (Signed)
Orthopedic Tech Progress Note Patient Details:  Casey Donaldson 08-17-1945 [id number]  Ortho Devices Type of Ortho Device: Knee Immobilizer Ortho Device/Splint Interventions: Application   Cammer, Mickie Bail 02/24/2013, 11:02 AM

## 2013-02-24 NOTE — Op Note (Signed)
NAME:  Casey Donaldson, Casey Donaldson NO.:  [id number]  MEDICAL RECORD NO.:  [id number]  LOCATION:  [id number]                        FACILITY:  [facility]  PHYSICIAN:  Burnard Bunting, M.D.    DATE OF BIRTH:  1945/11/19  DATE OF PROCEDURE: DATE OF DISCHARGE:                              OPERATIVE REPORT   PREOPERATIVE DIAGNOSIS:  Right infected total knee arthroplasty.  POSTOPERATIVE DIAGNOSIS:  Right infected total knee arthroplasty.  PROCEDURE:  Excision of arthroplasty, all components; right total knee replacement with implantation of antibiotic impregnated cement spacer, vancomycin, and Stimulan beads with nafcillin and Ancef.  SURGEON:  Burnard Bunting, M.D.  ASSISTANT:  Skip Mayer, P.A.  ANESTHESIA:  General endotracheal.  ESTIMATED BLOOD LOSS:  250.  DRAINS:  None.  TOURNIQUET TIME:  120 minutes at 300 mmHg.  INDICATIONS:  Casey Donaldson is a 67 year old patient with right knee infection, had total knee placed 5 years ago by Dr. Priscille Kluver.  Has right knee infection currently, presents for operative management after explanation of risks and benefits.  PROCEDURE IN DETAIL:  The patient was brought to the operating room, where general endotracheal anesthesia was induced.  Perioperative IV antibiotics were held until cultures were obtained.  Vancomycin given once his cultures were obtained.  Time-out was called.  Right leg was prescrubbed with alcohol and Betadine, prepped with DuraPrep solution and draped in a sterile manner, anterior approach.  The leg was elevated and exsanguinated with Esmarch wrap.  Tourniquet was inflated.  Anterior approach to the knee was made.  Skin and subcutaneous tissue were sharply divided.  Upon entering the knee, purulent fluid was encountered.  This was sent for culture.  Polyethylene spacer was removed.  Complete synovectomy, anterior and posterior performed.  Femur was removed by developing plane with small oscillating saw between  the prosthesis and the cement.  This allowed extraction with minimal bone loss.  Infection had gone up underneath the bone and was underneath the femoral component and then caused some osteomyelitis and wear.  However, the femoral component was not loose.  In a similar fashion, the tibial component was removed from the cement mantle.  It took an exceedingly long time in order to remove what was a very long cement column from the primary knee.  This extended down into the tibia.  The cement was removed.  At this time, following cement removal, the patella was removed.  All cement was removed from the patella as well.  At this time, thorough irrigation was performed.  Thorough excisional debridement also performed of devitalized-appearing soft tissue, including the posterior compartment region.  Following this, Stimulan beads 10 mL with Ancef and nafcillin were created and placed into the tibial shaft.  In a similar fashion, vancomycin-impregnated antibiotics were created for cement spacer, which was first placed on the femur and then the tibia.  Cement hardening occurred.  Stimulan beads 10 mL again placed in the medial and lateral gutters.  Tourniquet was released prior to doing this, and thorough irrigation again performed prior to placing these cement spacers into the knee.  Following placement of the cement spacers, the incision was closed in extension using interrupted inverted #1 Vicryl suture, 0-Vicryl suture, 2-0 Vicryl suture,  and skin staples. Skip Mayer' assistance was required during all times during the case for retraction including neurovascular structures, cement extraction. Her assistance was a medical necessity.     Burnard Bunting, M.D.     GSD/MEDQ  D:  02/23/2013  T:  02/24/2013  Job:  [id number]

## 2013-02-24 NOTE — Progress Notes (Signed)
Subjective: Pt stable - pain controlled   Objective: Vital signs in last 24 hours: Temp:  [97.1 F (36.2 C)-99.5 F (37.5 C)] 97.4 F (36.3 C) (09/10 0200) Pulse Rate:  [79-99] 80 (09/10 0200) Resp:  [13-22] 22 (09/10 0528) BP: (129-165)/(44-78) 144/46 mmHg (09/10 0200) SpO2:  [9 %-98 %] 93 % (09/10 0528)  Intake/Output from previous day: 09/09 0701 - 09/10 0700 In: 2850 [P.O.:600; I.V.:1800; IV Piggyback:100] Out: 900 [Urine:900] Intake/Output this shift:    Exam:  Neurovascular intact Sensation intact distally Intact pulses distally  Labs:  Recent Labs  02/23/13 1404 02/24/13 0519  HGB 12.7* 10.1*    Recent Labs  02/23/13 1404 02/24/13 0519  WBC 16.4* 15.7*  RBC 3.89* 3.26*  HCT 35.7* 30.1*  PLT 231 240    Recent Labs  02/23/13 1404 02/24/13 0519  NA 130* 128*  K 4.5 4.4  CL 94* 94*  CO2 23 25  BUN 19 PENDING  CREATININE 0.76 PENDING  GLUCOSE 108* 141*  CALCIUM 11.1* 10.1    Recent Labs  02/23/13 2321 02/24/13 0519  INR 1.13 1.15    Assessment/Plan: Na low - no free water - dc pca - id consult for abx issues - possible dc am if mobility ok - pwb 50 percent right leg - recheck na am   Adalberto Metzgar SCOTT 02/24/2013, 7:35 AM

## 2013-02-25 ENCOUNTER — Encounter (HOSPITAL_COMMUNITY): Payer: Self-pay | Admitting: Emergency Medicine

## 2013-02-25 LAB — BASIC METABOLIC PANEL
BUN: 24 mg/dL — ABNORMAL HIGH (ref 6–23)
Calcium: 10.2 mg/dL (ref 8.4–10.5)
Creatinine, Ser: 0.94 mg/dL (ref 0.50–1.35)
GFR calc Af Amer: >90 mL/min (ref >90)
GFR calc non Af Amer: 86 mL/min — ABNORMAL LOW (ref >90)

## 2013-02-25 LAB — CBC
HCT: 30.3 % — ABNORMAL LOW (ref 39.0–52.0)
MCH: 32 pg (ref 26.0–34.0)
MCHC: 34.7 g/dL (ref 30.0–36.0)
MCV: 92.4 fL (ref 78.0–100.0)
RDW: 13.2 % (ref 11.5–15.5)

## 2013-02-25 MED ORDER — WARFARIN SODIUM 5 MG PO TABS
5.0000 mg | ORAL_TABLET | Freq: Once | ORAL | Status: AC
  Administered 2013-02-25: 5 mg via ORAL
  Filled 2013-02-25: qty 1

## 2013-02-25 MED ORDER — TAMSULOSIN HCL 0.4 MG PO CAPS
0.4000 mg | ORAL_CAPSULE | Freq: Once | ORAL | Status: AC
  Administered 2013-02-25: 0.4 mg via ORAL
  Filled 2013-02-25: qty 1

## 2013-02-25 MED ORDER — PANTOPRAZOLE SODIUM 40 MG PO TBEC
40.0000 mg | DELAYED_RELEASE_TABLET | Freq: Every day | ORAL | Status: DC
  Administered 2013-02-25 – 2013-02-26 (×2): 40 mg via ORAL
  Filled 2013-02-25 (×2): qty 1

## 2013-02-25 NOTE — Progress Notes (Signed)
PHARMACY FOLLOW UP NOTE  Pharmacy Consult for : Coumadin Indication:  VTE prophylaxis s/p TKA  Dosing Weight: 131.5 kg  Labs:  Recent Labs  02/23/13 1404 02/23/13 2321 02/24/13 0519 02/25/13 0550 02/25/13 0855  HGB 12.7*  --  10.1* 10.5*  --   HCT 35.7*  --  30.1* 30.3*  --   PLT 231  --  240 293  --   LABPROT  --  14.3 14.5 17.9*  --   INR  --  1.13 1.15 1.52*  --   CREATININE 0.76  --  1.15  --  0.94   Lab Results  Component Value Date   INR 1.52* 02/25/2013   INR 1.15 02/24/2013   INR 1.13 02/23/2013    Medications:  Scheduled:  . atorvastatin  10 mg Oral q1800  . cefTRIAXone (ROCEPHIN)  IV  2 g Intravenous Q24H  . enalapril  20 mg Oral BID  . felodipine  5 mg Oral q morning - 10a  . fluticasone  2 spray Each Nare QHS  . nafcillin  1 g Intramuscular Once  . pantoprazole  40 mg Oral Daily  . warfarin   Does not apply Once  . Warfarin - Pharmacist Dosing Inpatient   Does not apply q1800    Assessment:  67 y/o male status post TKA on chronic Coumadin for VTE prophylaxis post-op.  INR today 1.52 [after 2 doses of Coumadin], Hgb 10.5 with No bleeding complications noted   Goal of Therapy:  INR 2-3   Plan:  Coumadin 5 mg today.  Daily INR's, CBC.  Monitor for bleeding complications.   Coumadin education.   Suzzanne Brunkhorst, Deetta Perla.D 02/25/2013, 1:06 PM

## 2013-02-25 NOTE — Clinical Social Work Psychosocial (Signed)
Clinical Social Work Department  BRIEF PSYCHOSOCIAL ASSESSMENT  Patient: Casey Donaldson. Account Number: [id number]  Admit date: 02/23/13 Clinical Social Worker Sabino Niemann, MSW Date/Time: 02/25/2013 2PM Referred by: Physician Date Referred:  Referred for   SNF Placement   Other Referral:  Interview type: Patient  Other interview type: PSYCHOSOCIAL DATA  Living Status: Wife Admitted from facility:  Level of care:  Primary support name: Cockrum,Mary  Primary support relationship to patient: Spouse Degree of support available:  Strong and vested  CURRENT CONCERNS  Current Concerns   Post-Acute Placement   Other Concerns:  SOCIAL WORK ASSESSMENT / PLAN  CSW met with pt re: PT recommendation for SNF.   Pt lives with his wife in [place]  CSW explained placement process and answered questions.   Pt reports [facility] as his preference    CSW completed FL2 and initiated SNF search.     Assessment/plan status: Information/Referral to [facility]  Other assessment/ plan:  Information/referral to community resources:  SNF     PATIENT'S/FAMILY'S RESPONSE TO PLAN OF CARE:  Pt  reports he is agreeable to [facility] in order to increase strength and independence with mobility prior to returning home  Pt verbalized understanding of placement process and appreciation for CSW assist.   Sabino Niemann, MSW [phone]

## 2013-02-25 NOTE — Progress Notes (Signed)
INFECTIOUS DISEASE PROGRESS NOTE  ID: Casey Donaldson. is a 67 y.o. male with  Active Problems:   * No active hospital problems. *  Subjective: Had to have foley replaced due to retention.  Denies f/c.  No loose Bm   Abtx:  Anti-infectives   Start     Dose/Rate Route Frequency Ordered Stop   02/24/13 0000  dextrose 5 % SOLN 50 mL with cefTRIAXone 2 G SOLR 2 g     2 g 100 mL/hr over 30 Minutes Intravenous Every 24 hours 02/24/13 1614     02/23/13 2215  ceFAZolin (ANCEF) IVPB 2 g/50 mL premix  Status:  Discontinued     2 g 100 mL/hr over 30 Minutes Intravenous Every 6 hours 02/23/13 2214 02/24/13 1445   02/23/13 2100  cefTRIAXone (ROCEPHIN) 2 g in dextrose 5 % 50 mL IVPB     2 g 100 mL/hr over 30 Minutes Intravenous Every 24 hours 02/23/13 2048 04/06/13 2059   02/23/13 1917  nafcillin injection  Status:  Discontinued       As needed 02/23/13 1917 02/23/13 2025   02/23/13 1917  ceFAZolin (ANCEF) powder  Status:  Discontinued       As needed 02/23/13 1936 02/23/13 2025   02/23/13 1900  ceFAZolin (ANCEF) powder 1 g     1 g Other To Surgery 02/23/13 1845 02/24/13 1900   02/23/13 1900  nafcillin injection 1 g     1 g Intramuscular  Once 02/23/13 1845     02/23/13 1638  vancomycin (VANCOCIN) powder  Status:  Discontinued       As needed 02/23/13 1638 02/23/13 2025   02/23/13 1615  vancomycin (VANCOCIN) 1,500 mg in sodium chloride 0.9 % 500 mL IVPB     1,500 mg 250 mL/hr over 120 Minutes Intravenous  Once 02/23/13 1605 02/23/13 1621   02/23/13 1600  ceFAZolin (ANCEF) powder 1 g     1 g Other To Surgery 02/23/13 1556 02/23/13 1616   02/23/13 1600  nafcillin injection 1 g     1 g Intravenous To Surgery 02/23/13 1556 02/23/13 1616      Medications:  Scheduled: . atorvastatin  10 mg Oral q1800  . cefTRIAXone (ROCEPHIN)  IV  2 g Intravenous Q24H  . enalapril  20 mg Oral BID  . felodipine  5 mg Oral q morning - 10a  . fluticasone  2 spray Each Nare QHS  . nafcillin  1 g  Intramuscular Once  . pantoprazole  40 mg Oral Daily  . warfarin  5 mg Oral ONCE-1800  . warfarin   Does not apply Once  . Warfarin - Pharmacist Dosing Inpatient   Does not apply q1800    Objective: Vital signs in last 24 hours: Temp:  [97.5 F (36.4 C)-100.3 F (37.9 C)] 100 F (37.8 C) (09/11 1300) Pulse Rate:  [69-86] 86 (09/11 1300) Resp:  [16-21] 16 (09/11 1300) BP: (144-176)/(58-70) 149/70 mmHg (09/11 1300) SpO2:  [91 %-95 %] 95 % (09/11 1300)   General appearance: alert, cooperative and no distress Extremities: RLE wrapped. slight decrease in light touch RLE vs LLE.   Lab Results  Recent Labs  02/24/13 0519 02/25/13 0550 02/25/13 0855  WBC 15.7* 21.5*  --   HGB 10.1* 10.5*  --   HCT 30.1* 30.3*  --   NA 128*  --  132*  K 4.4  --  4.3  CL 94*  --  97  CO2 25  --  26  BUN 23  --  24*  CREATININE 1.15  --  0.94   Liver Panel No results found for this basename: PROT, ALBUMIN, AST, ALT, ALKPHOS, BILITOT, BILIDIR, IBILI,  in the last 72 hours Sedimentation Rate No results found for this basename: ESRSEDRATE,  in the last 72 hours C-Reactive Protein No results found for this basename: CRP,  in the last 72 hours  Microbiology: Recent Results (from the past 240 hour(s))  BODY FLUID CULTURE     Status: None   Collection Time    02/23/13  4:19 PM      Result Value Range Status   Specimen Description FLUID RIGHT KNEE   Final   Special Requests FLUID ON SWAB PT ON KEFLEX CULTUES 1   Final   Gram Stain     Final   Value: ABUNDANT WBC PRESENT, PREDOMINANTLY PMN     FEW GRAM POSITIVE COCCI     IN PAIRS Gram Stain Report Called to,Read Back By and Verified With: Gram Stain Report Called to,Read Back By and Verified With: JUSTIN WARREN 02/24/13 7:28AM BY MILSH     Performed at [facility]   Culture     Final   Value: Culture reincubated for better growth     Performed at [facility]   Report Status PENDING   Incomplete  ANAEROBIC CULTURE      Status: None   Collection Time    02/23/13  4:19 PM      Result Value Range Status   Specimen Description FLUID RIGHT KNEE   Final   Special Requests FLUID ON SWAB PT ON KEFLEX CUTURES 1   Final   Gram Stain     Final   Value: ABUNDANT WBC PRESENT, PREDOMINANTLY PMN     FEW GRAM POSITIVE COCCI     IN PAIRS     Performed at [facility]   Culture     Final   Value: NO ANAEROBES ISOLATED; CULTURE IN PROGRESS FOR 5 DAYS     Performed at [facility]   Report Status PENDING   Incomplete  BODY FLUID CULTURE     Status: None   Collection Time    02/23/13  4:22 PM      Result Value Range Status   Specimen Description FLUID RIGHT KNEE   Final   Special Requests  RIGHT KNEE JOINT FLUID PT ON KEFLEX CULTURES 2   Final   Gram Stain     Final   Value: ABUNDANT WBC PRESENT,BOTH PMN AND MONONUCLEAR     RARE GRAM POSITIVE COCCI     IN PAIRS     Performed at [facility]   Culture     Final   Value: Culture reincubated for better growth     Performed at [facility]   Report Status PENDING   Incomplete  ANAEROBIC CULTURE     Status: None   Collection Time    02/23/13  4:22 PM      Result Value Range Status   Specimen Description FLUID RIGHT KNEE   Final   Special Requests FLUID ON SWAB PT ON KEFLEX CULTURES 2   Final   Gram Stain     Final   Value: ABUNDANT WBC PRESENT,BOTH PMN AND MONONUCLEAR     FEW GRAM POSITIVE COCCI     IN PAIRS     Performed at [facility]   Culture     Final  Value: NO ANAEROBES ISOLATED; CULTURE IN PROGRESS FOR 5 DAYS     Performed at [facility]   Report Status PENDING   Incomplete  CULTURE, BLOOD (ROUTINE X 2)     Status: None   Collection Time    02/23/13 11:21 PM      Result Value Range Status   Specimen Description BLOOD LEFT HAND   Final   Special Requests BOTTLES DRAWN AEROBIC ONLY 2CC   Final   Culture  Setup Time     Final   Value: 02/24/2013 09:29     Performed at [facility]   Culture      Final   Value:        BLOOD CULTURE RECEIVED NO GROWTH TO DATE CULTURE WILL BE HELD FOR 5 DAYS BEFORE ISSUING A FINAL NEGATIVE REPORT     Performed at [facility]   Report Status PENDING   Incomplete    Studies/Results: Dg Chest 2 View  02/23/2013   *RADIOLOGY REPORT*  Clinical Data: Hypertension.  Preoperative for knee surgery  CHEST - 2 VIEW  Comparison: February 01, 2008  Findings: The lung volumes are low. There is chronic elevation right hemidiaphragm.  There is no focal infiltrate, pulmonary edema, or pleural effusion.  The aorta is uncoiled.  The heart size is upper limits of normal.  The soft tissues and osseous structures are stable.  IMPRESSION: No acute cardiopulmonary disease identified.   Original Report Authenticated By: Sherian Rein, M.D.     Assessment/Plan: Osteomyelitis R knee  S/p resection of infected TKR  Total days of antibiotics: 3 (ceftriaxone)  Await Cx from OR.  Not clear why WBC increased, surgical stress? Continue to watch, get off foley PIC in place         Johny Sax Infectious Diseases (pager) [phone] [email or web address] 02/25/2013, 2:53 PM  LOS: 2 days

## 2013-02-25 NOTE — Progress Notes (Addendum)
Patient was in&out catheterized x2 throughout the night, last time was at 0545, and was due to void by 1145. Patient was unable to void on his own and per [facility] policy patient was bladder scanned and foley was inserted at 1230. Will continue to monitor.

## 2013-02-25 NOTE — Progress Notes (Signed)
Subjective: 2 Days Post-Op Procedure(s) (LRB): EXCISIONAL TOTAL KNEE ARTHROPLASTY WITH ANTIBIOTIC SPACERS (Right) Patient reports pain as moderate.  Complaining of gastric reflux symptoms.  Objective: Vital signs in last 24 hours: Temp:  [97.5 F (36.4 C)-100.3 F (37.9 C)] 97.5 F (36.4 C) (09/11 0500) Pulse Rate:  [69-81] 69 (09/11 0500) Resp:  [14-21] 18 (09/11 0500) BP: (144-176)/(55-68) 176/68 mmHg (09/11 0500) SpO2:  [91 %-96 %] 94 % (09/11 0500)  Intake/Output from previous day: 09/10 0701 - 09/11 0700 In: -  Out: 2600 [Urine:2600] Intake/Output this shift:     Recent Labs  02/23/13 1404 02/24/13 0519 02/25/13 0550  HGB 12.7* 10.1* 10.5*    Recent Labs  02/24/13 0519 02/25/13 0550  WBC 15.7* 21.5*  RBC 3.26* 3.28*  HCT 30.1* 30.3*  PLT 240 293    Recent Labs  02/23/13 1404 02/24/13 0519  NA 130* 128*  K 4.5 4.4  CL 94* 94*  CO2 23 25  BUN 19 23  CREATININE 0.76 1.15  GLUCOSE 108* 141*  CALCIUM 11.1* 10.1    Recent Labs  02/24/13 0519 02/25/13 0550  INR 1.15 1.52*    Sensation intact distally Intact pulses distally Dorsiflexion/Plantar flexion intact Incision: dressing C/D/I Compartment soft  Assessment/Plan: 2 Days Post-Op Procedure(s) (LRB): EXCISIONAL TOTAL KNEE ARTHROPLASTY WITH ANTIBIOTIC SPACERS (Right) Plan is d/c to SNF on Friday Protonix for gastric reflux Flomax for urinary retention Check BMET due to hyponatremia  Kwanza Cancelliere 02/25/2013, 7:30 AM

## 2013-02-25 NOTE — Progress Notes (Signed)
Physical Therapy Treatment Patient Details Name: Casey Donaldson. MRN: [id number] DOB: [birth year] Today's Date: 02/25/2013 Time: 4098-1191 PT Time Calculation (min): 17 min  PT Assessment / Plan / Recommendation  History of Present Illness Pt. underwent Rt TKA sx secondary to infection.   PT Comments   Pt able to incr amb today. Continues to require 2+ (A) for sit to stand transfers. Pt planned to D/C to SNF in [place] tomorrow. Cont per POC.   Follow Up Recommendations  SNF;Supervision/Assistance - 24 hour     Does the patient have the potential to tolerate intense rehabilitation     Barriers to Discharge        Equipment Recommendations  Other (comment) (TBD)    Recommendations for Other Services    Frequency 7X/week   Progress towards PT Goals Progress towards PT goals: Progressing toward goals  Plan Current plan remains appropriate    Precautions / Restrictions Precautions Precautions: Fall;Knee Required Braces or Orthoses: Knee Immobilizer - Right Restrictions Weight Bearing Restrictions: Yes RLE Weight Bearing: Partial weight bearing RLE Partial Weight Bearing Percentage or Pounds: 50   Pertinent Vitals/Pain 6/10 with WB; patient repositioned for comfort     Mobility  Bed Mobility Bed Mobility: Supine to Sit;Sitting - Scoot to Edge of Bed Supine to Sit: 4: Min assist;HOB elevated;With rails Sitting - Scoot to Edge of Bed: 4: Min assist Details for Bed Mobility Assistance: (A) to advance Rt LE to/off EOB; required incr time due to pain; cues for hand placement and sequencing; pt relies on handrails  Transfers Transfers: Sit to Stand;Stand to Sit Sit to Stand: 1: +2 Total assist;From bed;From elevated surface;With upper extremity assist Sit to Stand: Patient Percentage: 40% Stand to Sit: 4: Min assist;To chair/3-in-1;With armrests;With upper extremity assist Details for Transfer Assistance: pt with incr difficulty for sit to stand transfers; required 2  attempts again this session; mod cues for sequencing and hand placement; 2+ and multimodal cues to achieve full upright standing position; pt unsteady initially  Ambulation/Gait Ambulation/Gait Assistance: 4: Min assist Ambulation Distance (Feet): 20 Feet Assistive device: Rolling walker Ambulation/Gait Assistance Details: pt initally required (A) to advance Rt LE; became independent with advancing during amb; cues for gt sequencing and RW managament; pt required min (A) to manage RW and maintain balance; pt fatigues quickly Gait Pattern: Step-to pattern;Trunk flexed Gait velocity: decr Stairs: No Wheelchair Mobility Wheelchair Mobility: No    Exercises Total Joint Exercises Ankle Circles/Pumps: Both;10 reps;Seated   PT Diagnosis:    PT Problem List:   PT Treatment Interventions:     PT Goals (current goals can now be found in the care plan section) Acute Rehab PT Goals Patient Stated Goal: get moving better PT Goal Formulation: With patient Time For Goal Achievement: 03/03/13 Potential to Achieve Goals: Fair  Visit Information  Last PT Received On: 02/25/13 Assistance Needed: +2 History of Present Illness: Pt. underwent R sx secondary to infection.    Subjective Data  Subjective: pt lying supine; agreeable to therapy Patient Stated Goal: get moving better   Cognition  Cognition Arousal/Alertness: Awake/alert Behavior During Therapy: WFL for tasks assessed/performed Overall Cognitive Status: Within Functional Limits for tasks assessed    Balance  Balance Balance Assessed: No  End of Session PT - End of Session Equipment Utilized During Treatment: Gait belt;Right knee immobilizer Activity Tolerance: Patient tolerated treatment well Patient left: in chair;with call bell/phone within reach;with family/visitor present Nurse Communication: Mobility status   GP     [place], [place]  N, PT [phone] 02/25/2013, 11:16 AM

## 2013-02-25 NOTE — Progress Notes (Signed)
Patient unable to void after six hours. Patient was bladder scanned and 258cc was found to be in bladder. Patient was in and out catheterized and 600cc of urine was recorded for output. Nursing will continue to monitor.

## 2013-02-26 ENCOUNTER — Encounter (HOSPITAL_COMMUNITY): Payer: Self-pay | Admitting: Orthopedic Surgery

## 2013-02-26 DIAGNOSIS — R339 Retention of urine, unspecified: Secondary | ICD-10-CM | POA: Diagnosis not present

## 2013-02-26 DIAGNOSIS — T8459XA Infection and inflammatory reaction due to other internal joint prosthesis, initial encounter: Secondary | ICD-10-CM

## 2013-02-26 LAB — URINALYSIS, ROUTINE W REFLEX MICROSCOPIC
Bilirubin Urine: NEGATIVE
Ketones, ur: NEGATIVE mg/dL
Nitrite: NEGATIVE
pH: 6 (ref 5.0–8.0)

## 2013-02-26 LAB — CBC
MCH: 31.2 pg (ref 26.0–34.0)
MCHC: 33.8 g/dL (ref 30.0–36.0)
MCV: 92.2 fL (ref 78.0–100.0)
Platelets: 291 10*3/uL (ref 150–400)
RBC: 2.95 MIL/uL — ABNORMAL LOW (ref 4.22–5.81)

## 2013-02-26 LAB — URINE MICROSCOPIC-ADD ON

## 2013-02-26 MED ORDER — HEPARIN SOD (PORK) LOCK FLUSH 100 UNIT/ML IV SOLN
250.0000 [IU] | INTRAVENOUS | Status: DC | PRN
  Administered 2013-02-26: 250 [IU]
  Filled 2013-02-26: qty 3

## 2013-02-26 MED ORDER — HEPARIN SOD (PORK) LOCK FLUSH 100 UNIT/ML IV SOLN
250.0000 [IU] | Freq: Every day | INTRAVENOUS | Status: DC
  Filled 2013-02-26: qty 3

## 2013-02-26 MED ORDER — WARFARIN SODIUM 5 MG PO TABS
5.0000 mg | ORAL_TABLET | Freq: Once | ORAL | Status: DC
  Filled 2013-02-26: qty 1

## 2013-02-26 NOTE — Progress Notes (Signed)
Subjective: 3 Days Post-Op Procedure(s) (LRB): EXCISIONAL TOTAL KNEE ARTHROPLASTY WITH ANTIBIOTIC SPACERS (Right) Patient reports pain as mild. Well controlled with po meds.   WBC still trending upward. Foley out this am and no void yet.  Recalls urinary retention after last TKA but not otherwise.  Flomax started yesterday Will check UA.  Place foley if no void by 12:00 Clarified instructions for weight bearing.  Pt will continue 50% PWB and wear knee immobilizer full time.  No active ROM exercises or CPM needed.   Objective: Vital signs in last 24 hours: Temp:  [97.7 F (36.5 C)-100 F (37.8 C)] 98.6 F (37 C) (09/12 0630) Pulse Rate:  [85-88] 85 (09/12 0630) Resp:  [16-19] 17 (09/12 0800) BP: (146-156)/(64-70) 146/64 mmHg (09/12 0630) SpO2:  [95 %-99 %] 98 % (09/12 0800)  Intake/Output from previous day: 09/11 0701 - 09/12 0700 In: 20 [I.V.:20] Out: 800 [Urine:800] Intake/Output this shift:     Recent Labs  02/23/13 1404 02/24/13 0519 02/25/13 0550 02/26/13 0500  HGB 12.7* 10.1* 10.5* 9.2*    Recent Labs  02/25/13 0550 02/26/13 0500  WBC 21.5* 24.2*  RBC 3.28* 2.95*  HCT 30.3* 27.2*  PLT 293 291    Recent Labs  02/24/13 0519 02/25/13 0855  NA 128* 132*  K 4.4 4.3  CL 94* 97  CO2 25 26  BUN 23 24*  CREATININE 1.15 0.94  GLUCOSE 141* 156*  CALCIUM 10.1 10.2    Recent Labs  02/25/13 0550 02/26/13 0500  INR 1.52* 1.72*    Sensation intact distally Intact pulses distally Dorsiflexion/Plantar flexion intact Incision: dressing C/D/I  Assessment/Plan: 3 Days Post-Op Procedure(s) (LRB): EXCISIONAL TOTAL KNEE ARTHROPLASTY WITH ANTIBIOTIC SPACERS (Right) Up with therapy Continue ABX therapy due to Culture taken of surgical site during joint revision Urinary retention:  If no void by 12:00 place foley and will dc with foley and leg bag Check UA, C&S today  Hyponatremia improved and pt asymptomatic today.  Had some confusion yesterday, but more  clear today per family and pt. Final OR cultures pending Plan for dc to NH today.  Will await final recommendations for abx from ID today.  Casey Donaldson 02/26/2013, 11:04 AM

## 2013-02-26 NOTE — Progress Notes (Signed)
Physical Therapy Treatment Patient Details Name: Casey Donaldson. MRN: [id number] DOB: 25-Nov-1945 Today's Date: 02/26/2013 Time: 9562-1308 PT Time Calculation (min): 25 min  PT Assessment / Plan / Recommendation  History of Present Illness Pt. underwent R sx secondary to infection.   PT Comments   Pt is progressing with mobility. Continues to have difficulty with sit to stand transfers. Plans to D/C to [facility] today for cont rehab prior to returning home.   Follow Up Recommendations  SNF;Supervision/Assistance - 24 hour     Does the patient have the potential to tolerate intense rehabilitation     Barriers to Discharge        Equipment Recommendations  Other (comment) (TBD)    Recommendations for Other Services    Frequency 7X/week   Progress towards PT Goals Progress towards PT goals: Progressing toward goals  Plan Current plan remains appropriate    Precautions / Restrictions Precautions Precautions: Fall;Knee Required Braces or Orthoses: Knee Immobilizer - Right Restrictions Weight Bearing Restrictions: Yes RLE Weight Bearing: Partial weight bearing RLE Partial Weight Bearing Percentage or Pounds: 50   Pertinent Vitals/Pain 2.5/10 pt premedicated    Mobility  Bed Mobility Bed Mobility: Not assessed Details for Bed Mobility Assistance: pt sitting in recliner and returned to recliner Transfers Transfers: Sit to Stand;Stand to Sit Sit to Stand: 1: +2 Total assist;From chair/3-in-1;With armrests;With upper extremity assist Sit to Stand: Patient Percentage: 80% Stand to Sit: 4: Min assist;To chair/3-in-1;With armrests Details for Transfer Assistance: performed sit to stand x7 to increase independence and strength with transfer; pt initially reqired 2+ (A) with 40-50% but progressed to 80%; cues to use ant/post weightshift to build momentum prior to standing up; 2+ (A) to help maintain balance and PWB status on Rt LE; requires increased time for transfers; pt has  difficulty sliding Rt LE out in front to avoid incr WB on it with stand to sit; requires (A) to maintain WB status   Ambulation/Gait Ambulation/Gait Assistance: 4: Min guard Ambulation Distance (Feet): 50 Feet Assistive device: Rolling walker Ambulation/Gait Assistance Details: cues for gt sequencing and upright posture; pt amb with decr bil step lengths and relies heavily on UEs to maintain PWB status; min cues to maintain PWB status on Rt LE  Gait Pattern: Step-to pattern;Trunk flexed Gait velocity: decr Stairs: No Wheelchair Mobility Wheelchair Mobility: No    Exercises Total Joint Exercises Ankle Circles/Pumps: Both;10 reps;Seated Quad Sets: AROM;Right;10 reps;Seated Other Exercises Other Exercises: sit to stand x7; see transfer details    PT Diagnosis:    PT Problem List:   PT Treatment Interventions:     PT Goals (current goals can now be found in the care plan section) Acute Rehab PT Goals Patient Stated Goal: to get better PT Goal Formulation: With patient Time For Goal Achievement: 03/03/13 Potential to Achieve Goals: Fair  Visit Information  Last PT Received On: 02/26/13 Assistance Needed: +2 (for sit to stand) History of Present Illness: Pt. underwent R sx secondary to infection.    Subjective Data  Subjective: pt sitting in chair; agreeable to therapy. " i appreciate your expert advice. i just want to get better" Patient Stated Goal: to get better   Cognition  Cognition Arousal/Alertness: Awake/alert Behavior During Therapy: WFL for tasks assessed/performed Overall Cognitive Status: Within Functional Limits for tasks assessed    Balance  Balance Balance Assessed: Yes Static Standing Balance Static Standing - Balance Support: Bilateral upper extremity supported;During functional activity Static Standing - Level of Assistance: 5: Stand by  assistance  End of Session PT - End of Session Equipment Utilized During Treatment: Gait belt;Right knee  immobilizer Activity Tolerance: Patient tolerated treatment well Patient left: in chair;with call bell/phone within reach;with family/visitor present Nurse Communication: Mobility status   GP     Donell Sievert, [place] [phone] 02/26/2013, 10:09 AM

## 2013-02-26 NOTE — Progress Notes (Signed)
ANTICOAGULATION CONSULT NOTE - Follow Up Consult  Pharmacy Consult for Coumadin Indication: VTE prophylaxis  No Known Allergies  Patient Measurements: Height: 5' 9.5" (176.5 cm) Weight: 290 lb (131.543 kg) IBW/kg (Calculated) : 71.85 Heparin Dosing Weight:   Vital Signs: Temp: 98.5 F (36.9 C) (09/12 1244) BP: 140/57 mmHg (09/12 1244) Pulse Rate: 70 (09/12 1244)  Labs:  Recent Labs  02/24/13 0519 02/25/13 0550 02/25/13 0855 02/26/13 0500  HGB 10.1* 10.5*  --  9.2*  HCT 30.1* 30.3*  --  27.2*  PLT 240 293  --  291  LABPROT 14.5 17.9*  --  19.7*  INR 1.15 1.52*  --  1.72*  CREATININE 1.15  --  0.94  --     Estimated Creatinine Clearance: 106.1 ml/min (by C-G formula based on Cr of 0.94).   Medications:  Scheduled:  . atorvastatin  10 mg Oral q1800  . cefTRIAXone (ROCEPHIN)  IV  2 g Intravenous Q24H  . enalapril  20 mg Oral BID  . felodipine  5 mg Oral q morning - 10a  . fluticasone  2 spray Each Nare QHS  . heparin lock flush  250 Units Intracatheter Daily  . nafcillin  1 g Intramuscular Once  . pantoprazole  40 mg Oral Daily  . warfarin   Does not apply Once  . Warfarin - Pharmacist Dosing Inpatient   Does not apply q1800    Assessment: 67yo male s/p excisional TKA and placement of antibiotic spacers.  INR 1.72 this AM and moving nicely toward goal of 2-3.  Hg is 9.2, down some more.  No bleeding problems noted.    Goal of Therapy:  INR 2-3 Monitor platelets by anticoagulation protocol: Yes   Plan:  1.  Repeat Coumadin 5mg  2.  F/U in AM  Marisue Humble, PharmD Clinical Pharmacist [facility]

## 2013-02-26 NOTE — Progress Notes (Signed)
Patient discharged and transported via EMS to [facility].  RN called report to [facility] at [facility].  Patient left in stable condition.

## 2013-02-26 NOTE — Progress Notes (Signed)
INFECTIOUS DISEASE PROGRESS NOTE  ID: Casey Donaldson. is a 67 y.o. male with  Active Problems:   * No active hospital problems. *  Subjective: No BM  Abtx:  Anti-infectives   Start     Dose/Rate Route Frequency Ordered Stop   02/24/13 0000  dextrose 5 % SOLN 50 mL with cefTRIAXone 2 G SOLR 2 g     2 g 100 mL/hr over 30 Minutes Intravenous Every 24 hours 02/24/13 1614     02/23/13 2215  ceFAZolin (ANCEF) IVPB 2 g/50 mL premix  Status:  Discontinued     2 g 100 mL/hr over 30 Minutes Intravenous Every 6 hours 02/23/13 2214 02/24/13 1445   02/23/13 2100  cefTRIAXone (ROCEPHIN) 2 g in dextrose 5 % 50 mL IVPB     2 g 100 mL/hr over 30 Minutes Intravenous Every 24 hours 02/23/13 2048 04/06/13 2059   02/23/13 1917  nafcillin injection  Status:  Discontinued       As needed 02/23/13 1917 02/23/13 2025   02/23/13 1917  ceFAZolin (ANCEF) powder  Status:  Discontinued       As needed 02/23/13 1936 02/23/13 2025   02/23/13 1900  ceFAZolin (ANCEF) powder 1 g     1 g Other To Surgery 02/23/13 1845 02/24/13 1900   02/23/13 1900  nafcillin injection 1 g     1 g Intramuscular  Once 02/23/13 1845     02/23/13 1638  vancomycin (VANCOCIN) powder  Status:  Discontinued       As needed 02/23/13 1638 02/23/13 2025   02/23/13 1615  vancomycin (VANCOCIN) 1,500 mg in sodium chloride 0.9 % 500 mL IVPB     1,500 mg 250 mL/hr over 120 Minutes Intravenous  Once 02/23/13 1605 02/23/13 1621   02/23/13 1600  ceFAZolin (ANCEF) powder 1 g     1 g Other To Surgery 02/23/13 1556 02/23/13 1616   02/23/13 1600  nafcillin injection 1 g     1 g Intravenous To Surgery 02/23/13 1556 02/23/13 1616      Medications:  Scheduled: . atorvastatin  10 mg Oral q1800  . cefTRIAXone (ROCEPHIN)  IV  2 g Intravenous Q24H  . enalapril  20 mg Oral BID  . felodipine  5 mg Oral q morning - 10a  . fluticasone  2 spray Each Nare QHS  . nafcillin  1 g Intramuscular Once  . pantoprazole  40 mg Oral Daily  . warfarin    Does not apply Once  . Warfarin - Pharmacist Dosing Inpatient   Does not apply q1800    Objective: Vital signs in last 24 hours: Temp:  [97.7 F (36.5 C)-100 F (37.8 C)] 98.6 F (37 C) (09/12 0630) Pulse Rate:  [85-88] 85 (09/12 0630) Resp:  [16-19] 17 (09/12 0800) BP: (146-156)/(64-70) 146/64 mmHg (09/12 0630) SpO2:  [95 %-99 %] 98 % (09/12 0800)   General appearance: alert, cooperative and no distress GI: abnormal findings:  distended and hypoactive bowel sounds Extremities: RLE is warm, swollen. wound is clean. no fluctuance. no erythema.   Lab Results  Recent Labs  02/24/13 0519 02/25/13 0550 02/25/13 0855 02/26/13 0500  WBC 15.7* 21.5*  --  24.2*  HGB 10.1* 10.5*  --  9.2*  HCT 30.1* 30.3*  --  27.2*  NA 128*  --  132*  --   K 4.4  --  4.3  --   CL 94*  --  97  --   CO2 25  --  26  --   BUN 23  --  24*  --   CREATININE 1.15  --  0.94  --    Liver Panel No results found for this basename: PROT, ALBUMIN, AST, ALT, ALKPHOS, BILITOT, BILIDIR, IBILI,  in the last 72 hours Sedimentation Rate No results found for this basename: ESRSEDRATE,  in the last 72 hours C-Reactive Protein No results found for this basename: CRP,  in the last 72 hours  Microbiology: Recent Results (from the past 240 hour(s))  BODY FLUID CULTURE     Status: None   Collection Time    02/23/13  4:19 PM      Result Value Range Status   Specimen Description FLUID RIGHT KNEE   Final   Special Requests FLUID ON SWAB PT ON KEFLEX CULTUES 1   Final   Gram Stain     Final   Value: ABUNDANT WBC PRESENT, PREDOMINANTLY PMN     FEW GRAM POSITIVE COCCI     IN PAIRS Gram Stain Report Called to,Read Back By and Verified With: Gram Stain Report Called to,Read Back By and Verified With: JUSTIN WARREN 02/24/13 7:28AM BY MILSH     Performed at [facility]   Culture     Final   Value: Culture reincubated for better growth     Performed at [facility]   Report Status PENDING   Incomplete    ANAEROBIC CULTURE     Status: None   Collection Time    02/23/13  4:19 PM      Result Value Range Status   Specimen Description FLUID RIGHT KNEE   Final   Special Requests FLUID ON SWAB PT ON KEFLEX CUTURES 1   Final   Gram Stain     Final   Value: ABUNDANT WBC PRESENT, PREDOMINANTLY PMN     FEW GRAM POSITIVE COCCI     IN PAIRS     Performed at [facility]   Culture     Final   Value: NO ANAEROBES ISOLATED; CULTURE IN PROGRESS FOR 5 DAYS     Performed at [facility]   Report Status PENDING   Incomplete  BODY FLUID CULTURE     Status: None   Collection Time    02/23/13  4:22 PM      Result Value Range Status   Specimen Description FLUID RIGHT KNEE   Final   Special Requests  RIGHT KNEE JOINT FLUID PT ON KEFLEX CULTURES 2   Final   Gram Stain     Final   Value: ABUNDANT WBC PRESENT,BOTH PMN AND MONONUCLEAR     RARE GRAM POSITIVE COCCI     IN PAIRS     Performed at [facility]   Culture     Final   Value: Culture reincubated for better growth     Performed at [facility]   Report Status PENDING   Incomplete  ANAEROBIC CULTURE     Status: None   Collection Time    02/23/13  4:22 PM      Result Value Range Status   Specimen Description FLUID RIGHT KNEE   Final   Special Requests FLUID ON SWAB PT ON KEFLEX CULTURES 2   Final   Gram Stain     Final   Value: ABUNDANT WBC PRESENT,BOTH PMN AND MONONUCLEAR     FEW GRAM POSITIVE COCCI     IN PAIRS     Performed at [facility]  Culture     Final   Value: NO ANAEROBES ISOLATED; CULTURE IN PROGRESS FOR 5 DAYS     Performed at [facility]   Report Status PENDING   Incomplete  CULTURE, BLOOD (ROUTINE X 2)     Status: None   Collection Time    02/23/13 11:21 PM      Result Value Range Status   Specimen Description BLOOD LEFT HAND   Final   Special Requests BOTTLES DRAWN AEROBIC ONLY 2CC   Final   Culture  Setup Time     Final   Value: 02/24/2013 09:29     Performed at [facility]   Culture     Final   Value:        BLOOD CULTURE RECEIVED NO GROWTH TO DATE CULTURE WILL BE HELD FOR 5 DAYS BEFORE ISSUING A FINAL NEGATIVE REPORT     Performed at [facility]   Report Status PENDING   Incomplete    Studies/Results: No results found.   Assessment/Plan: Osteomyelitis R knee  Prev Cx viridans strep S/p resection of infected TKR   Total days of antibiotics: 4  (ceftriaxone)  Some concern about his rising WBC.  Operative Cx is growing gamma step, not completed Needs bowel hygeine, bladder retraining.  Tm 100 Plan for d/c to SNF F/u in ID in 2 weeks Needs weekly labs (CBC, CMP) , send to [phone]         Johny Sax Infectious Diseases (pager) [phone] [email or web address] 02/26/2013, 11:37 AM  LOS: 3 days

## 2013-02-27 LAB — BODY FLUID CULTURE

## 2013-02-27 LAB — URINE CULTURE: Culture: NO GROWTH

## 2013-02-28 LAB — ANAEROBIC CULTURE

## 2013-03-02 LAB — CULTURE, BLOOD (ROUTINE X 2)

## 2013-03-09 NOTE — Clinical Social Work Placement (Signed)
Clinical Social Work Department  CLINICAL SOCIAL WORK PLACEMENT NOTE   Patient: Casey Donaldson. Account Number: [id number]   Admit date: 02/23/2013 Clinical Social Worker: Sabino Niemann LCSWA Date/time: 02/25/13 11:30 AM  Clinical Social Work is seeking post-discharge placement for this patient at the following level of care: SKILLED NURSING (*CSW will update this form in Epic as items are completed)  02/25/2013 Patient/family provided with [facility] Department of Clinical Social Work's list of facilities offering this level of care within the geographic area requested by the patient (or if unable, by the patient's family).  02/25/2013 Patient/family informed of their freedom to choose among providers that offer the needed level of care, that participate in Medicare, Medicaid or managed care program needed by the patient, have an available bed and are willing to accept the patient.  Patient/family informed of [facility]' ownership interest in [facility], as well as of the fact that they are under no obligation to receive care at this facility.  PASARR submitted to EDS on 02/25/13 PASARR number received from EDS on 02/25/2013 FL2 transmitted to all facilities in geographic area requested by pt/family on 02/25/13 FL2 transmitted to all facilities within larger geographic area on  Patient informed that his/her managed care company has contracts with or will negotiate with certain facilities, including the following:  Patient/family informed of bed offers received: 02/26/13 Patient chooses bed at [facility] Physician recommends and patient chooses bed at  Patient to be transferred to on 02/26/13 Patient to be transferred to facility by [facility] The following physician request were entered in Epic:  Additional Comments:  Signing off

## 2013-03-11 ENCOUNTER — Inpatient Hospital Stay: Payer: Medicare Other | Admitting: Infectious Disease

## 2013-03-23 ENCOUNTER — Ambulatory Visit (INDEPENDENT_AMBULATORY_CARE_PROVIDER_SITE_OTHER): Payer: Medicare Other | Admitting: Internal Medicine

## 2013-03-23 VITALS — BP 181/80 | HR 73 | Temp 98.5°F | Wt 282.0 lb

## 2013-03-23 DIAGNOSIS — T889XXS Complication of surgical and medical care, unspecified, sequela: Secondary | ICD-10-CM

## 2013-03-23 DIAGNOSIS — T8459XS Infection and inflammatory reaction due to other internal joint prosthesis, sequela: Secondary | ICD-10-CM

## 2013-03-23 NOTE — Addendum Note (Signed)
Addendum created 03/23/13 1310 by Kipp Brood, MD   Modules edited: Anesthesia Responsible Staff

## 2013-03-23 NOTE — Progress Notes (Signed)
[facility] CLINIC NOTE  RFV: follow up for stage 3 revision/abtx spacer Subjective:    Patient ID: Casey Donaldson., male    DOB: 30-Sep-1945, 67 y.o.   MRN: [id number]  HPI Casey Donaldson is a 67yo M with left TKA 2008 but had swelling and pain noted on 9/3, aspriatoin showed c/w PJI. He underwent resection with culture finding strep anginosis excisional prosthetic joint with antibiotic spacer on 02/23/13, plan to have IV antibiotics of ceftriaxone 2gm IV daily until 04/12/13. Plan to be off of antibiotics for 2 wk and check trend of inflammatory markers, cbc with diff. And as well as repeat 4 wks with aspiration off of antibiotics to ensure clearance of infection  Current Outpatient Prescriptions on File Prior to Visit  Medication Sig Dispense Refill  . dextrose 5 % SOLN 50 mL with cefTRIAXone 2 G SOLR 2 g Inject 2 g into the vein daily.  90 g  0  . enalapril (VASOTEC) 20 MG tablet Take 20 mg by mouth 2 (two) times daily.      . felodipine (PLENDIL) 5 MG 24 hr tablet Take 5 mg by mouth every morning.      . methocarbamol (ROBAXIN) 500 MG tablet Take 1 tablet (500 mg total) by mouth every 6 (six) hours as needed.  30 tablet  0  . oxyCODONE (OXY IR/ROXICODONE) 5 MG immediate release tablet Take 1-2 tablets (5-10 mg total) by mouth every 3 (three) hours as needed.  60 tablet  0  . rosuvastatin (CRESTOR) 20 MG tablet Take 20 mg by mouth at bedtime.      Marland Kitchen warfarin (COUMADIN) 5 MG tablet Take 1 tablet (5 mg total) by mouth daily.  30 tablet  1  . fluticasone (FLONASE) 50 MCG/ACT nasal spray Place 2 sprays into both nostrils at bedtime.       No current facility-administered medications on file prior to visit.   Active Ambulatory Problems    Diagnosis Date Noted  . Infection of total knee replacement 02/26/2013  . Urinary retention 02/26/2013   Resolved Ambulatory Problems    Diagnosis Date Noted  . No Resolved Ambulatory Problems   Past Medical History  Diagnosis Date  . Hypertension   .  Heart murmur   . Sleep apnea   . GERD (gastroesophageal reflux disease)   . Cancer    History  Substance Use Topics  . Smoking status: Former Games developer  . Smokeless tobacco: Not on file  . Alcohol Use: Yes     Comment: socially  family history includes Coronary artery disease in his father; Hypertension in his mother.  Review of Systems 12 point ROS is negative except that he feels that he has ongoing phlegm, mostly in the morning, that he coughs up. And occasional post tussis emesis    Objective:   Physical Exam BP 181/80  Pulse 73  Temp(Src) 98.5 F (36.9 C) (Oral)  Wt 282 lb (127.914 kg)  BMI 41.06 kg/m2 Physical Exam  Constitutional: He is oriented to person, place, and time. He appears well-developed and well-nourished. No distress.  HENT:  Mouth/Throat: Oropharynx is clear and moist. No oropharyngeal exudate.  Cardiovascular: Normal rate, regular rhythm and normal heart sounds. Exam reveals no gallop and no friction rub.  No murmur heard.  Pulmonary/Chest: Effort normal and breath sounds normal. No respiratory distress. He has no wheezes.  Abdominal: Soft. Bowel sounds are normal. He exhibits no distension. There is no tenderness.  Lymphadenopathy:  He has no cervical adenopathy.  Neurological: He is alert and oriented to person, place, and time.  Skin: Skin is warm and dry. No rash noted. No erythema.  Psychiatric: He has a normal mood and affect. His behavior is normal.  Ext: left arm picc line is c/d/i     Assessment & Plan:  Underwent 2 stage revision. On 3 of 6th weeks of  Ceftriaxone 2gm IV daily until 10/27  ?dysphagia= he is doing eating behavior modification to help with his symptoms. Dry food is causing symptoms. Will recommend barium swallow.

## 2013-04-12 ENCOUNTER — Encounter: Payer: Self-pay | Admitting: Internal Medicine

## 2013-04-12 ENCOUNTER — Ambulatory Visit (INDEPENDENT_AMBULATORY_CARE_PROVIDER_SITE_OTHER): Payer: Medicare Other | Admitting: Internal Medicine

## 2013-04-12 VITALS — BP 162/81 | HR 96 | Temp 97.6°F | Ht 69.0 in | Wt 256.0 lb

## 2013-04-12 DIAGNOSIS — B20 Human immunodeficiency virus [HIV] disease: Secondary | ICD-10-CM

## 2013-04-12 LAB — C-REACTIVE PROTEIN: CRP: 1.1 mg/dL — ABNORMAL HIGH (ref <0.60)

## 2013-04-12 LAB — SEDIMENTATION RATE: Sed Rate: 65 mm/hr — ABNORMAL HIGH (ref 0–16)

## 2013-04-12 NOTE — Progress Notes (Signed)
[facility] CLINIC NOTE  RFV: follow up on PJI, stage 2 revision Subjective:    Patient ID: Casey Cloud., male    DOB: 16-Sep-1945, 67 y.o.   MRN: [id number]  HPI Casey Donaldson is a 67yo M with left TKA 2008 but had swelling and pain noted on 02/17/13, aspiration showed c/w PJI. He underwent resection of prosthetic joint with antibiotic spacer with culture identifying strep anginosis on 02/23/13. He was discharged to have IV antibiotics of ceftriaxone 2gm IV daily until 04/12/13. He states he is doing well with his infusions. No thrush, diarrhea, or fungal infection. No fever, chills, nightsweats.  the plan to be off of antibiotics for 2 wk and check trend of inflammatory markers, cbc with diff. And as well as repeat 4 wks with aspiration off of antibiotics to ensure  clearance of infection.  Next appt with ortho is Nov 5th.  Current Outpatient Prescriptions on File Prior to Visit  Medication Sig Dispense Refill  . dextrose 5 % SOLN 50 mL with cefTRIAXone 2 G SOLR 2 g Inject 2 g into the vein daily.  90 g  0  . enalapril (VASOTEC) 20 MG tablet Take 20 mg by mouth 2 (two) times daily.      . felodipine (PLENDIL) 5 MG 24 hr tablet Take 5 mg by mouth every morning.      . fluticasone (FLONASE) 50 MCG/ACT nasal spray Place 2 sprays into both nostrils at bedtime.      . rosuvastatin (CRESTOR) 20 MG tablet Take 20 mg by mouth at bedtime.      Marland Kitchen warfarin (COUMADIN) 5 MG tablet Take 1 tablet (5 mg total) by mouth daily.  30 tablet  1   No current facility-administered medications on file prior to visit.   Active Ambulatory Problems    Diagnosis Date Noted  . Infection of total knee replacement 02/26/2013  . Urinary retention 02/26/2013   Resolved Ambulatory Problems    Diagnosis Date Noted  . No Resolved Ambulatory Problems   Past Medical History  Diagnosis Date  . Hypertension   . Heart murmur   . Sleep apnea   . GERD (gastroesophageal reflux disease)   . Cancer     Review of  Systems  Constitutional: Negative for fever, chills, diaphoresis, activity change, appetite change, fatigue and unexpected weight change.  HENT: Negative for congestion, sore throat, rhinorrhea, sneezing, trouble swallowing and sinus pressure.  Eyes: Negative for photophobia and visual disturbance.  Respiratory: Negative for cough, chest tightness, shortness of breath, wheezing and stridor.  Cardiovascular: Negative for chest pain, palpitations and leg swelling.  Gastrointestinal: Negative for nausea, vomiting, abdominal pain, diarrhea, constipation, blood in stool, abdominal distention and anal bleeding.  Genitourinary: Negative for dysuria, hematuria, flank pain and difficulty urinating.  Musculoskeletal: Negative for myalgias, back pain, joint swelling, arthralgias and gait problem.  Skin: Negative for color change, pallor, rash and wound.  Neurological: Negative for dizziness, tremors, weakness and light-headedness.  Hematological: Negative for adenopathy. Does not bruise/bleed easily.  Psychiatric/Behavioral: Negative for behavioral problems, confusion, sleep disturbance, dysphoric mood, decreased concentration and agitation.      Objective:   Physical Exam BP 162/81  Pulse 96  Temp(Src) 97.6 F (36.4 C) (Oral)  Ht 5\' 9"  (1.753 m)  Wt 256 lb (116.121 kg)  BMI 37.79 kg/m2  Constitutional: He is oriented to person, place, and time. He appears well-developed and well-nourished. No distress.  HENT:  Mouth/Throat: Oropharynx is clear and moist. No oropharyngeal exudate.  Cardiovascular:  Normal rate, regular rhythm and normal heart sounds. Exam reveals no gallop and no friction rub.  No murmur heard.  Pulmonary/Chest: Effort normal and breath sounds normal. No respiratory distress. He has no wheezes.  Abdominal: Soft. Bowel sounds are normal. He exhibits no distension. There is no tenderness.  Lymphadenopathy:  no cervical adenopathy.  Ext: right leg incision site well healed. Mild  edema to joint but not significant increased warmth in joint. Neurological: He is alert and oriented to person, place, and time.  Skin: Skin is warm and dry. No rash noted. No erythema.  Psychiatric: He has a normal mood and affect. His behavior is normal.      Assessment & Plan:  pji = will check sed rate and crp today, will pull picc line today since the patient has finished 6 wks course of therapy. He is to see orthopedics in 2 wks to do further blood work and possibly arthrocentesis.   Spent 45 minutes with patient, had picc line removed. 50% of time spent on counseling  rtc prn or per ortho request

## 2013-04-12 NOTE — Progress Notes (Signed)
Per Dr Drue Second  46cm Single lumen Peripherally Inserted Central Catheter  removed from left basilic . No sutures present. Dressing was clean and dry . Area cleansed with chlorhexidine and petroleum dressing applied. Pt advised no heavy lifting with this arm, leave dressing for 24 hours and call the office if dressing becomes soaked with blood or sharp pain presents.  Pt tolerated procedure well.   Labs drawn prior to removal.   Laurell Josephs, RN

## 2013-04-27 ENCOUNTER — Other Ambulatory Visit (HOSPITAL_COMMUNITY): Payer: Self-pay | Admitting: Orthopedic Surgery

## 2013-05-03 ENCOUNTER — Encounter (HOSPITAL_COMMUNITY): Payer: Self-pay | Admitting: Pharmacy Technician

## 2013-05-06 ENCOUNTER — Encounter (HOSPITAL_COMMUNITY): Payer: Self-pay

## 2013-05-06 ENCOUNTER — Encounter (HOSPITAL_COMMUNITY)
Admission: RE | Admit: 2013-05-06 | Discharge: 2013-05-06 | Disposition: A | Discharge status: Home / Self Care | Payer: Medicare Other | Source: Ambulatory Visit | Attending: Orthopedic Surgery | Admitting: Orthopedic Surgery

## 2013-05-06 DIAGNOSIS — Z01818 Encounter for other preprocedural examination: Secondary | ICD-10-CM | POA: Insufficient documentation

## 2013-05-06 DIAGNOSIS — Z01812 Encounter for preprocedural laboratory examination: Secondary | ICD-10-CM | POA: Insufficient documentation

## 2013-05-06 HISTORY — DX: Pure hypercholesterolemia, unspecified: E78.00

## 2013-05-06 HISTORY — DX: Effusion, unspecified joint: M25.40

## 2013-05-06 HISTORY — DX: Adverse effect of unspecified anesthetic, initial encounter: T41.45XA

## 2013-05-06 HISTORY — DX: Other complications of anesthesia, initial encounter: T88.59XA

## 2013-05-06 HISTORY — DX: Pain in unspecified joint: M25.50

## 2013-05-06 LAB — CBC
Hemoglobin: 11.2 g/dL — ABNORMAL LOW (ref 13.0–17.0)
Platelets: 310 10*3/uL (ref 150–400)
RBC: 3.8 MIL/uL — ABNORMAL LOW (ref 4.22–5.81)
WBC: 10.8 10*3/uL — ABNORMAL HIGH (ref 4.0–10.5)

## 2013-05-06 LAB — PROTIME-INR
INR: 2.27 — ABNORMAL HIGH (ref 0.00–1.49)
Prothrombin Time: 24.3 seconds — ABNORMAL HIGH (ref 11.6–15.2)

## 2013-05-06 LAB — BASIC METABOLIC PANEL
Calcium: 10.3 mg/dL (ref 8.4–10.5)
GFR calc Af Amer: >90 mL/min (ref >90)
GFR calc non Af Amer: >90 mL/min (ref >90)
Potassium: 4.1 mEq/L (ref 3.5–5.1)
Sodium: 135 mEq/L (ref 135–145)

## 2013-05-06 LAB — SURGICAL PCR SCREEN
MRSA, PCR: NEGATIVE
Staphylococcus aureus: NEGATIVE

## 2013-05-06 NOTE — Progress Notes (Signed)
Per Bonita Quin in blood bank, sample (Type & Screen) needs to be recollected on day of surgery, order also placed.

## 2013-05-06 NOTE — Pre-Procedure Instructions (Signed)
Dellia Cloud.  05/06/2013   Your procedure is scheduled on:  Tuesday, May 18, 2013 at 11:00 am  Report to [facility] Entrance "A" [address] at 9:00 AM.  Call this number if you have problems the morning of surgery: [phone]   Remember:   Do not eat food or drink liquids after midnight.   Take these medicines the morning of surgery with A SIP OF WATER: Flonase (nose spray) and felodipine (Plendil)   Stop taking Coumadin on Tuesday, November 25   STOP Aspirin, Aleve, Naproxen, Advil, Ibuprofen, Vitamins, Herbs, and Supplements starting    Do not wear jewelry, make-up or nail polish.  Do not wear lotions, powders, or perfumes. You may wear deodorant.  Do not shave 48 hours prior to surgery. Men may shave face and neck.  Do not bring valuables to the hospital.  [facility] is not responsible                  for any belongings or valuables.               Contacts, dentures or bridgework may not be worn into surgery.  Leave suitcase in the car. After surgery it may be brought to your room.  For patients admitted to the hospital, discharge time is determined by your                treatment team.               Special Instructions: Shower using CHG 2 nights before surgery and the night before surgery.  If you shower the day of surgery use CHG.  Use special wash - you have one bottle of CHG for all showers.  You should use approximately 1/3 of the bottle for each shower.   Please read over the following fact sheets that you were given: Pain Booklet, Coughing and Deep Breathing, Blood Transfusion Information and Surgical Site Infection Prevention

## 2013-05-06 NOTE — Progress Notes (Signed)
Left message with Casey Donaldson regarding consent. Patient advised he has already have components removed and antibiotics spacer placed. Advised this surgery they are supposed to be placing components back in depending upon what they find during surgery.

## 2013-05-07 NOTE — Progress Notes (Addendum)
Anesthesia Chart Review:  Patient is a 67 year old male scheduled for removal of cement spacer, possible reimplantation, revision of TKA on 05/18/13 by Dr. August Saucer.  History includes former smoker, HTN, GERD, OSA with CPAP use, obesity, hypercholesterolemia, heart murmur (not specified), melanoma, tonsillectomy, left eye surgery, right TKA in 2009 s/p excision arthroplasty with insertion of antibiotic spacer for Strep viridans 02/23/13.  He reported that he had some dysphagia following one surgery.  PCP is Dr. Gwendlyn Deutscher in [place].  EKG on 11/02/12 (scanned under Media) showed SR, right BBB.   CXR on 02/23/13 showed: No acute cardiopulmonary disease identified.  Preoperative labs noted.   It appears that UA, C&S were not done at PAT. (I left chart for RN follow-up--to notify surgeon office to ensure that it is okay for these to be done on the day or surgery.)  Blood bank also reported that T&S needs to be recollected on the DOS due to antibioties.  He will need a repeat PT/PTT on arrival.  He is scheduled to hold his Coumadin starting 05/11/13.  If results are acceptable then I would anticipate that he could proceed as planned.  Velna Ochs [facility] Short Stay Center/Anesthesiology Phone [phone] 05/07/2013 1:48 PM

## 2013-05-17 MED ORDER — CEFAZOLIN SODIUM-DEXTROSE 2-3 GM-% IV SOLR
2.0000 g | INTRAVENOUS | Status: DC
  Filled 2013-05-17: qty 50

## 2013-05-18 ENCOUNTER — Inpatient Hospital Stay (HOSPITAL_COMMUNITY)
Admission: RE | Admit: 2013-05-18 | Discharge: 2013-05-21 | DRG: 468 | Disposition: A | Discharge status: Home Health | Payer: Medicare Other | Source: Ambulatory Visit | Attending: Orthopedic Surgery | Admitting: Orthopedic Surgery

## 2013-05-18 ENCOUNTER — Inpatient Hospital Stay (HOSPITAL_COMMUNITY): Payer: Medicare Other | Admitting: Certified Registered Nurse Anesthetist

## 2013-05-18 ENCOUNTER — Encounter (HOSPITAL_COMMUNITY): Payer: Self-pay

## 2013-05-18 ENCOUNTER — Encounter (HOSPITAL_COMMUNITY)
Admission: RE | Disposition: A | Discharge status: Home Health | Payer: Self-pay | Source: Ambulatory Visit | Attending: Orthopedic Surgery

## 2013-05-18 ENCOUNTER — Encounter (HOSPITAL_COMMUNITY): Payer: Medicare Other | Admitting: Vascular Surgery

## 2013-05-18 ENCOUNTER — Inpatient Hospital Stay (HOSPITAL_COMMUNITY): Payer: Medicare Other

## 2013-05-18 DIAGNOSIS — M899 Disorder of bone, unspecified: Secondary | ICD-10-CM | POA: Diagnosis present

## 2013-05-18 DIAGNOSIS — M1710 Unilateral primary osteoarthritis, unspecified knee: Secondary | ICD-10-CM | POA: Diagnosis present

## 2013-05-18 DIAGNOSIS — Z96659 Presence of unspecified artificial knee joint: Secondary | ICD-10-CM

## 2013-05-18 DIAGNOSIS — T8459XA Infection and inflammatory reaction due to other internal joint prosthesis, initial encounter: Secondary | ICD-10-CM

## 2013-05-18 DIAGNOSIS — G4730 Sleep apnea, unspecified: Secondary | ICD-10-CM | POA: Diagnosis present

## 2013-05-18 DIAGNOSIS — E7800 Pure hypercholesterolemia, unspecified: Secondary | ICD-10-CM | POA: Diagnosis present

## 2013-05-18 DIAGNOSIS — M21869 Other specified acquired deformities of unspecified lower leg: Principal | ICD-10-CM | POA: Diagnosis present

## 2013-05-18 DIAGNOSIS — Z85820 Personal history of malignant melanoma of skin: Secondary | ICD-10-CM

## 2013-05-18 DIAGNOSIS — I10 Essential (primary) hypertension: Secondary | ICD-10-CM | POA: Diagnosis present

## 2013-05-18 DIAGNOSIS — M6789 Other specified disorders of synovium and tendon, multiple sites: Secondary | ICD-10-CM | POA: Diagnosis present

## 2013-05-18 DIAGNOSIS — T8459XD Infection and inflammatory reaction due to other internal joint prosthesis, subsequent encounter: Secondary | ICD-10-CM

## 2013-05-18 DIAGNOSIS — Z79899 Other long term (current) drug therapy: Secondary | ICD-10-CM

## 2013-05-18 DIAGNOSIS — Z87891 Personal history of nicotine dependence: Secondary | ICD-10-CM

## 2013-05-18 DIAGNOSIS — Z7901 Long term (current) use of anticoagulants: Secondary | ICD-10-CM

## 2013-05-18 DIAGNOSIS — K219 Gastro-esophageal reflux disease without esophagitis: Secondary | ICD-10-CM | POA: Diagnosis present

## 2013-05-18 HISTORY — DX: Unspecified osteoarthritis, unspecified site: M19.90

## 2013-05-18 HISTORY — PX: TOTAL KNEE REVISION: SHX996

## 2013-05-18 HISTORY — DX: Anemia, unspecified: D64.9

## 2013-05-18 HISTORY — DX: Malignant melanoma of other part of trunk: C43.59

## 2013-05-18 LAB — TYPE AND SCREEN
ABO/RH(D): A NEG
Antibody Screen: POSITIVE

## 2013-05-18 LAB — PROTIME-INR
INR: 1.04 (ref 0.00–1.49)
Prothrombin Time: 13.4 seconds (ref 11.6–15.2)

## 2013-05-18 LAB — URINALYSIS, ROUTINE W REFLEX MICROSCOPIC
Bilirubin Urine: NEGATIVE
Glucose, UA: NEGATIVE mg/dL
Hgb urine dipstick: NEGATIVE
Nitrite: NEGATIVE
Specific Gravity, Urine: 1.016 (ref 1.005–1.030)
pH: 6.5 (ref 5.0–8.0)

## 2013-05-18 LAB — APTT: aPTT: 29 seconds (ref 24–37)

## 2013-05-18 SURGERY — TOTAL KNEE REVISION
Anesthesia: Monitor Anesthesia Care | Site: Knee | Laterality: Right

## 2013-05-18 MED ORDER — ONDANSETRON HCL 4 MG PO TABS: 4.0000 mg | ORAL_TABLET | Freq: Four times a day (QID) | ORAL | Status: DC | PRN

## 2013-05-18 MED ORDER — POTASSIUM CHLORIDE IN NACL 20-0.9 MEQ/L-% IV SOLN
INTRAVENOUS | Status: DC
  Administered 2013-05-18: 22:00:00 via INTRAVENOUS
  Filled 2013-05-18 (×2): qty 1000

## 2013-05-18 MED ORDER — WARFARIN - PHARMACIST DOSING INPATIENT: Freq: Every day | Status: DC

## 2013-05-18 MED ORDER — GENTAMICIN SULFATE 40 MG/ML IJ SOLN
INTRAMUSCULAR | Status: DC | PRN
  Administered 2013-05-18: 160 mg

## 2013-05-18 MED ORDER — CEFAZOLIN SODIUM-DEXTROSE 2-3 GM-% IV SOLR
2.0000 g | Freq: Three times a day (TID) | INTRAVENOUS | Status: AC
  Administered 2013-05-18 – 2013-05-20 (×6): 2 g via INTRAVENOUS
  Filled 2013-05-18 (×6): qty 50

## 2013-05-18 MED ORDER — PHENYLEPHRINE HCL 10 MG/ML IJ SOLN
10.0000 mg | INTRAVENOUS | Status: DC | PRN
  Administered 2013-05-18: 50 ug/min via INTRAVENOUS

## 2013-05-18 MED ORDER — CHLORHEXIDINE GLUCONATE 4 % EX LIQD: 60.0000 mL | Freq: Once | CUTANEOUS | Status: DC

## 2013-05-18 MED ORDER — EPHEDRINE SULFATE 50 MG/ML IJ SOLN
INTRAMUSCULAR | Status: DC | PRN
  Administered 2013-05-18 (×2): 10 mg via INTRAVENOUS
  Administered 2013-05-18 (×2): 15 mg via INTRAVENOUS

## 2013-05-18 MED ORDER — METOCLOPRAMIDE HCL 5 MG/ML IJ SOLN: 5.0000 mg | Freq: Three times a day (TID) | INTRAMUSCULAR | Status: DC | PRN

## 2013-05-18 MED ORDER — HYDROMORPHONE HCL PF 1 MG/ML IJ SOLN
0.5000 mg | Freq: Once | INTRAMUSCULAR | Status: AC
  Administered 2013-05-18: 0.5 mg via INTRAVENOUS

## 2013-05-18 MED ORDER — OXYCODONE HCL 5 MG PO TABS
5.0000 mg | ORAL_TABLET | ORAL | Status: DC | PRN
  Administered 2013-05-18 – 2013-05-19 (×2): 10 mg via ORAL
  Filled 2013-05-18 (×2): qty 2

## 2013-05-18 MED ORDER — FENTANYL CITRATE 0.05 MG/ML IJ SOLN
INTRAMUSCULAR | Status: AC
  Filled 2013-05-18: qty 2

## 2013-05-18 MED ORDER — CEFAZOLIN SODIUM-DEXTROSE 2-3 GM-% IV SOLR
2.0000 g | Freq: Once | INTRAVENOUS | Status: DC
  Filled 2013-05-18: qty 50

## 2013-05-18 MED ORDER — PHENYLEPHRINE HCL 10 MG/ML IJ SOLN
INTRAMUSCULAR | Status: DC | PRN
  Administered 2013-05-18: 120 ug via INTRAVENOUS
  Administered 2013-05-18 (×2): 80 ug via INTRAVENOUS

## 2013-05-18 MED ORDER — VANCOMYCIN HCL 500 MG IV SOLR
INTRAVENOUS | Status: AC
  Filled 2013-05-18: qty 500

## 2013-05-18 MED ORDER — THROMBIN 20000 UNITS EX KIT
PACK | CUTANEOUS | Status: AC
  Filled 2013-05-18: qty 1

## 2013-05-18 MED ORDER — LIDOCAINE HCL (CARDIAC) 20 MG/ML IV SOLN
INTRAVENOUS | Status: DC | PRN
  Administered 2013-05-18: 40 mg via INTRAVENOUS

## 2013-05-18 MED ORDER — DOCUSATE SODIUM 100 MG PO CAPS
100.0000 mg | ORAL_CAPSULE | Freq: Two times a day (BID) | ORAL | Status: DC
  Administered 2013-05-18 – 2013-05-21 (×6): 100 mg via ORAL
  Filled 2013-05-18 (×7): qty 1

## 2013-05-18 MED ORDER — 0.9 % SODIUM CHLORIDE (POUR BTL) OPTIME
TOPICAL | Status: DC | PRN
  Administered 2013-05-18 (×4): 1000 mL

## 2013-05-18 MED ORDER — SODIUM CHLORIDE 0.9 % IR SOLN
Status: DC | PRN
  Administered 2013-05-18: 3000 mL

## 2013-05-18 MED ORDER — ENALAPRIL MALEATE 20 MG PO TABS
20.0000 mg | ORAL_TABLET | Freq: Two times a day (BID) | ORAL | Status: DC
  Administered 2013-05-18 – 2013-05-21 (×6): 20 mg via ORAL
  Filled 2013-05-18 (×7): qty 1

## 2013-05-18 MED ORDER — OXYCODONE HCL 5 MG PO TABS
5.0000 mg | ORAL_TABLET | Freq: Once | ORAL | Status: AC | PRN
  Administered 2013-05-18: 5 mg via ORAL

## 2013-05-18 MED ORDER — OXYCODONE HCL 5 MG/5ML PO SOLN: 5.0000 mg | Freq: Once | ORAL | Status: AC | PRN

## 2013-05-18 MED ORDER — LACTATED RINGERS IV SOLN
INTRAVENOUS | Status: DC
  Administered 2013-05-18: 10:00:00 via INTRAVENOUS

## 2013-05-18 MED ORDER — BUPIVACAINE IN DEXTROSE 0.75-8.25 % IT SOLN
INTRATHECAL | Status: DC | PRN
  Administered 2013-05-18: 1.8 mL via INTRATHECAL

## 2013-05-18 MED ORDER — FELODIPINE ER 5 MG PO TB24
5.0000 mg | ORAL_TABLET | Freq: Every morning | ORAL | Status: DC
  Administered 2013-05-19 – 2013-05-21 (×3): 5 mg via ORAL
  Filled 2013-05-18 (×3): qty 1

## 2013-05-18 MED ORDER — HYDROMORPHONE HCL PF 1 MG/ML IJ SOLN
INTRAMUSCULAR | Status: AC
  Filled 2013-05-18: qty 1

## 2013-05-18 MED ORDER — TOBRAMYCIN SULFATE 1.2 G IJ SOLR
INTRAMUSCULAR | Status: AC
  Filled 2013-05-18: qty 1.2

## 2013-05-18 MED ORDER — METOCLOPRAMIDE HCL 10 MG PO TABS: 5.0000 mg | ORAL_TABLET | Freq: Three times a day (TID) | ORAL | Status: DC | PRN

## 2013-05-18 MED ORDER — ATORVASTATIN CALCIUM 10 MG PO TABS
10.0000 mg | ORAL_TABLET | Freq: Every day | ORAL | Status: DC
  Administered 2013-05-19 – 2013-05-20 (×2): 10 mg via ORAL
  Filled 2013-05-18 (×3): qty 1

## 2013-05-18 MED ORDER — WARFARIN VIDEO
1.0000 | Freq: Once | Status: AC
  Administered 2013-05-19: 1

## 2013-05-18 MED ORDER — LIDOCAINE HCL (PF) 2 % IJ SOLN
INTRAMUSCULAR | Status: DC | PRN
  Administered 2013-05-18: 2 mL via INTRADERMAL
  Administered 2013-05-18: 3 mL
  Administered 2013-05-18: 5 mL via INTRADERMAL
  Administered 2013-05-18: 1 mL via INTRADERMAL
  Administered 2013-05-18: 3 mL via INTRADERMAL
  Administered 2013-05-18: 2 mL via INTRADERMAL
  Administered 2013-05-18: 5 mL via INTRADERMAL

## 2013-05-18 MED ORDER — COUMADIN BOOK
1.0000 | Freq: Once | Status: AC
  Administered 2013-05-18: 1
  Filled 2013-05-18: qty 1

## 2013-05-18 MED ORDER — MENTHOL 3 MG MT LOZG: 1.0000 | LOZENGE | OROMUCOSAL | Status: DC | PRN

## 2013-05-18 MED ORDER — HYDROMORPHONE HCL PF 1 MG/ML IJ SOLN: 1.0000 mg | INTRAMUSCULAR | Status: DC | PRN

## 2013-05-18 MED ORDER — HYDROCHLOROTHIAZIDE 12.5 MG PO CAPS
12.5000 mg | ORAL_CAPSULE | Freq: Every day | ORAL | Status: DC
  Administered 2013-05-18 – 2013-05-21 (×4): 12.5 mg via ORAL
  Filled 2013-05-18 (×4): qty 1

## 2013-05-18 MED ORDER — GENTAMICIN SULFATE 40 MG/ML IJ SOLN
INTRAMUSCULAR | Status: AC
  Filled 2013-05-18: qty 2

## 2013-05-18 MED ORDER — DEXTROSE 5 % IV SOLN
500.0000 mg | Freq: Four times a day (QID) | INTRAVENOUS | Status: DC | PRN
  Filled 2013-05-18: qty 5

## 2013-05-18 MED ORDER — PHENOL 1.4 % MT LIQD: 1.0000 | OROMUCOSAL | Status: DC | PRN

## 2013-05-18 MED ORDER — WARFARIN SODIUM 6 MG PO TABS
12.0000 mg | ORAL_TABLET | ORAL | Status: AC
  Administered 2013-05-18: 12 mg via ORAL
  Filled 2013-05-18: qty 2

## 2013-05-18 MED ORDER — ONDANSETRON HCL 4 MG/2ML IJ SOLN: 4.0000 mg | Freq: Four times a day (QID) | INTRAMUSCULAR | Status: DC | PRN

## 2013-05-18 MED ORDER — CELECOXIB 200 MG PO CAPS
200.0000 mg | ORAL_CAPSULE | Freq: Two times a day (BID) | ORAL | Status: DC
  Administered 2013-05-19 – 2013-05-21 (×5): 200 mg via ORAL
  Filled 2013-05-18 (×8): qty 1

## 2013-05-18 MED ORDER — LACTATED RINGERS IV SOLN
INTRAVENOUS | Status: DC | PRN
  Administered 2013-05-18 (×3): via INTRAVENOUS

## 2013-05-18 MED ORDER — METHOCARBAMOL 500 MG PO TABS
500.0000 mg | ORAL_TABLET | Freq: Four times a day (QID) | ORAL | Status: DC | PRN
  Administered 2013-05-18 – 2013-05-21 (×7): 500 mg via ORAL
  Filled 2013-05-18 (×7): qty 1

## 2013-05-18 MED ORDER — ACETAMINOPHEN 325 MG PO TABS: 650.0000 mg | ORAL_TABLET | Freq: Four times a day (QID) | ORAL | Status: DC | PRN

## 2013-05-18 MED ORDER — VANCOMYCIN HCL 500 MG IV SOLR
INTRAVENOUS | Status: DC | PRN
  Administered 2013-05-18: 500 mg

## 2013-05-18 MED ORDER — FENTANYL CITRATE 0.05 MG/ML IJ SOLN
INTRAMUSCULAR | Status: DC | PRN
  Administered 2013-05-18: 50 ug via INTRAVENOUS

## 2013-05-18 MED ORDER — FENTANYL CITRATE 0.05 MG/ML IJ SOLN
25.0000 ug | INTRAMUSCULAR | Status: DC | PRN
  Administered 2013-05-18: 50 ug via INTRAVENOUS
  Administered 2013-05-18 (×2): 25 ug via INTRAVENOUS
  Administered 2013-05-18: 50 ug via INTRAVENOUS

## 2013-05-18 MED ORDER — CEFAZOLIN SODIUM-DEXTROSE 2-3 GM-% IV SOLR
2.0000 g | INTRAVENOUS | Status: AC
  Administered 2013-05-18 (×2): 2 g via INTRAVENOUS

## 2013-05-18 MED ORDER — LIDOCAINE HCL (PF) 2 % IJ SOLN
10.0000 mL | Freq: Once | INTRAMUSCULAR | Status: DC
  Filled 2013-05-18: qty 10

## 2013-05-18 MED ORDER — METHOCARBAMOL 500 MG PO TABS
ORAL_TABLET | ORAL | Status: AC
  Filled 2013-05-18: qty 1

## 2013-05-18 MED ORDER — OXYCODONE HCL 5 MG PO TABS
ORAL_TABLET | ORAL | Status: AC
  Filled 2013-05-18: qty 1

## 2013-05-18 MED ORDER — ACETAMINOPHEN 650 MG RE SUPP: 650.0000 mg | Freq: Four times a day (QID) | RECTAL | Status: DC | PRN

## 2013-05-18 MED ORDER — FLUTICASONE PROPIONATE 50 MCG/ACT NA SUSP
2.0000 | Freq: Every day | NASAL | Status: DC
  Administered 2013-05-18 – 2013-05-20 (×3): 2 via NASAL
  Filled 2013-05-18: qty 16

## 2013-05-18 MED ORDER — PROPOFOL INFUSION 10 MG/ML OPTIME
INTRAVENOUS | Status: DC | PRN
  Administered 2013-05-18: 14:00:00 via INTRAVENOUS
  Administered 2013-05-18: 100 ug/kg/min via INTRAVENOUS
  Administered 2013-05-18: 13:00:00 via INTRAVENOUS

## 2013-05-18 MED ORDER — PROPOFOL 10 MG/ML IV BOLUS
INTRAVENOUS | Status: DC | PRN
  Administered 2013-05-18: 50 mg via INTRAVENOUS

## 2013-05-18 SURGICAL SUPPLY — 92 items
ADAPTER BOLT FEMORAL +2/-2 (Knees) ×2 IMPLANT
ADPR FEM +2/-2 OFST BOLT (Knees) ×1 IMPLANT
ADPR FEM 5D STRL KN PFC SGM (Orthopedic Implant) ×1 IMPLANT
AUG FEM SZ3 8 CMB POST STRL LF (Knees) ×2 IMPLANT
BANDAGE ELASTIC 4 VELCRO ST LF (GAUZE/BANDAGES/DRESSINGS) ×2 IMPLANT
BANDAGE ELASTIC 6 VELCRO ST LF (GAUZE/BANDAGES/DRESSINGS) ×2 IMPLANT
BANDAGE ESMARK 6X9 LF (GAUZE/BANDAGES/DRESSINGS) ×1 IMPLANT
BLADE SAG 18X100X1.27 (BLADE) ×2 IMPLANT
BLADE SAW SGTL 13.0X1.19X90.0M (BLADE) ×2 IMPLANT
BLADE SURG 10 STRL SS (BLADE) ×4 IMPLANT
BNDG CMPR 9X6 STRL LF SNTH (GAUZE/BANDAGES/DRESSINGS) ×1
BNDG COHESIVE 6X5 TAN STRL LF (GAUZE/BANDAGES/DRESSINGS) ×2 IMPLANT
BNDG ESMARK 6X9 LF (GAUZE/BANDAGES/DRESSINGS) ×2
BONE CEMENT PALACOS R-G (Orthopedic Implant) ×6 IMPLANT
BOWL SMART MIX CTS (DISPOSABLE) ×2 IMPLANT
CEMENT BONE PALACOS R-G (Orthopedic Implant) ×3 IMPLANT
CEMENT HV SMART SET (Cement) IMPLANT
CLOTH BEACON ORANGE TIMEOUT ST (SAFETY) ×2 IMPLANT
COMP FEM CEM RT SZ3 (Orthopedic Implant) ×2 IMPLANT
COMPONENT FEM CEM RT SZ3 (Orthopedic Implant) ×1 IMPLANT
COVER BACK TABLE 24X17X13 BIG (DRAPES) IMPLANT
COVER SURGICAL LIGHT HANDLE (MISCELLANEOUS) ×2 IMPLANT
CUFF TOURNIQUET SINGLE 34IN LL (TOURNIQUET CUFF) ×2 IMPLANT
CUFF TOURNIQUET SINGLE 44IN (TOURNIQUET CUFF) IMPLANT
DRAPE INCISE IOBAN 66X45 STRL (DRAPES) ×2 IMPLANT
DRAPE ORTHO SPLIT 77X108 STRL (DRAPES) ×4
DRAPE PROXIMA HALF (DRAPES) ×2 IMPLANT
DRAPE SURG ORHT 6 SPLT 77X108 (DRAPES) ×2 IMPLANT
DRAPE U-SHAPE 47X51 STRL (DRAPES) ×2 IMPLANT
DRAPE X-RAY CASS 24X20 (DRAPES) IMPLANT
DRSG PAD ABDOMINAL 8X10 ST (GAUZE/BANDAGES/DRESSINGS) ×2 IMPLANT
DURAPREP 26ML APPLICATOR (WOUND CARE) ×2 IMPLANT
ELECT REM PT RETURN 9FT ADLT (ELECTROSURGICAL) ×2
ELECTRODE REM PT RTRN 9FT ADLT (ELECTROSURGICAL) ×1 IMPLANT
EVACUATOR 1/8 PVC DRAIN (DRAIN) ×2 IMPLANT
FACESHIELD LNG OPTICON STERILE (SAFETY) ×2 IMPLANT
FEMORAL ADAPTER (Orthopedic Implant) ×2 IMPLANT
GAUZE XEROFORM 5X9 LF (GAUZE/BANDAGES/DRESSINGS) ×2 IMPLANT
GLOVE BIO SURGEON ST LM GN SZ9 (GLOVE) ×2 IMPLANT
GLOVE BIOGEL PI IND STRL 7.5 (GLOVE) ×2 IMPLANT
GLOVE BIOGEL PI IND STRL 8 (GLOVE) ×3 IMPLANT
GLOVE BIOGEL PI INDICATOR 7.5 (GLOVE) ×2
GLOVE BIOGEL PI INDICATOR 8 (GLOVE) ×3
GLOVE ECLIPSE 7.0 STRL STRAW (GLOVE) ×10 IMPLANT
GLOVE SURG ORTHO 8.0 STRL STRW (GLOVE) ×2 IMPLANT
GOWN PREVENTION PLUS LG XLONG (DISPOSABLE) ×6 IMPLANT
GOWN PREVENTION PLUS XLARGE (GOWN DISPOSABLE) ×2 IMPLANT
GOWN STRL NON-REIN LRG LVL3 (GOWN DISPOSABLE) ×6 IMPLANT
HANDPIECE INTERPULSE COAX TIP (DISPOSABLE) ×2
HOOD PEEL AWAY FACE SHEILD DIS (HOOD) ×8 IMPLANT
IMMOBILIZER KNEE 20 (SOFTGOODS)
IMMOBILIZER KNEE 20 THIGH 36 (SOFTGOODS) IMPLANT
IMMOBILIZER KNEE 22 UNIV (SOFTGOODS) ×2 IMPLANT
IMMOBILIZER KNEE 24 THIGH 36 (MISCELLANEOUS) IMPLANT
IMMOBILIZER KNEE 24 UNIV (MISCELLANEOUS)
INSERT SZ3 TC3 25MM (Knees) ×2 IMPLANT
KIT BASIN OR (CUSTOM PROCEDURE TRAY) ×2 IMPLANT
KIT ROOM TURNOVER OR (KITS) ×2 IMPLANT
KIT STIMULAN RAPID CURE  10CC (Orthopedic Implant) ×1 IMPLANT
KIT STIMULAN RAPID CURE 10CC (Orthopedic Implant) ×1 IMPLANT
MANIFOLD NEPTUNE II (INSTRUMENTS) ×2 IMPLANT
NEEDLE SPNL 18GX3.5 QUINCKE PK (NEEDLE) ×2 IMPLANT
NS IRRIG 1000ML POUR BTL (IV SOLUTION) ×4 IMPLANT
PACK TOTAL JOINT (CUSTOM PROCEDURE TRAY) ×2 IMPLANT
PAD ARMBOARD 7.5X6 YLW CONV (MISCELLANEOUS) ×4 IMPLANT
PAD CAST 4YDX4 CTTN HI CHSV (CAST SUPPLIES) ×1 IMPLANT
PADDING CAST COTTON 4X4 STRL (CAST SUPPLIES) ×2
PADDING CAST COTTON 6X4 STRL (CAST SUPPLIES) ×2 IMPLANT
POST AVE PFC 8MM (Knees) ×4 IMPLANT
RUBBERBAND STERILE (MISCELLANEOUS) ×2 IMPLANT
SET HNDPC FAN SPRY TIP SCT (DISPOSABLE) ×1 IMPLANT
SLEEVE TIB MBT 27/45 40 (Knees) ×2 IMPLANT
SPONGE GAUZE 4X4 12PLY (GAUZE/BANDAGES/DRESSINGS) ×2 IMPLANT
SPONGE LAP 18X18 X RAY DECT (DISPOSABLE) ×6 IMPLANT
STAPLER VISISTAT 35W (STAPLE) ×2 IMPLANT
STEM  REV  115X14 (Stem) ×1 IMPLANT
STEM FLUTED UNIV REV 75X20 (Stem) ×2 IMPLANT
STEM REV 115X14 (Stem) ×1 IMPLANT
SUCTION FRAZIER TIP 10 FR DISP (SUCTIONS) ×2 IMPLANT
SUT ETHILON 3 0 PS 1 (SUTURE) IMPLANT
SUT VIC AB 0 CTB1 27 (SUTURE) ×4 IMPLANT
SUT VIC AB 1 CT1 27 (SUTURE) ×12
SUT VIC AB 1 CT1 27XBRD ANBCTR (SUTURE) ×6 IMPLANT
SUT VIC AB 2-0 CT1 27 (SUTURE) ×8
SUT VIC AB 2-0 CT1 TAPERPNT 27 (SUTURE) ×4 IMPLANT
SYR 30ML SLIP (SYRINGE) ×2 IMPLANT
TOWEL OR 17X24 6PK STRL BLUE (TOWEL DISPOSABLE) ×2 IMPLANT
TOWEL OR 17X26 10 PK STRL BLUE (TOWEL DISPOSABLE) ×4 IMPLANT
TRAY FOLEY CATH 16FRSI W/METER (SET/KITS/TRAYS/PACK) ×2 IMPLANT
TRAY REVISION SZ 3 (Knees) ×2 IMPLANT
TUBE ANAEROBIC SPECIMEN COL (MISCELLANEOUS) ×2 IMPLANT
WATER STERILE IRR 1000ML POUR (IV SOLUTION) ×2 IMPLANT

## 2013-05-18 NOTE — H&P (Signed)
TOTAL KNEE REVISION ADMISSION H&P  Patient is being admitted for right revision total knee arthroplasty.  Subjective:  Chief Complaint:right knee pain.  HPI: Dellia Cloud., 67 y.o. male, has a history of pain and functional disability in the right knee(s) due to failed previous arthroplasty and patient has failed non-surgical conservative treatments for greater than 12 weeks to include Activities which in this case to not apply with his the patient had an infection in his total knee replacement placed 5 years ago by Dr. Priscille Kluver. The indications for the revision of the total knee arthroplasty are history of total knee infection. Onset of symptoms was abrupt starting 3 months ago years ago with rapidlly worsening course since that time.  Prior procedures on the right knee(s) include arthroplasty.  Patient currently rates pain in the right knee(s) at 6 out of 10 with activity. There is night pain, worsening of pain with activity and weight bearing, pain that interferes with activities of daily living, pain with passive range of motion, crepitus and joint swelling.  Patient has evidence of Antibiotic spacer placement by imaging studies. This condition presents safety issues increasing the risk of falls. This patient has had Previously well-functioning total knee replacement which became infected with a strep organism.  Laboratory values have been improving but the presence or absence of infection will be assessed more clearly at the time of revision arthroplasty and antibiotic cement spacer removal  Patient Active Problem List   Diagnosis Date Noted  . Infection of total knee replacement 02/26/2013    Class: Diagnosis of  . Urinary retention 02/26/2013   Past Medical History  Diagnosis Date  . Hypertension   . Heart murmur   . Sleep apnea     cpap  . Cancer     melanoma  . Complication of anesthesia     Pt reports dysphagia after last surgery.  . Joint pain     ankles and wrists  .  Joint swelling     wrists and ankles  . GERD (gastroesophageal reflux disease)     takes OTC meds  . High cholesterol     Past Surgical History  Procedure Laterality Date  . Eye surgery      left eye  . Arthroscopic knee Left   . Joint replacement Right   . Excisional total knee arthroplasty with antibiotic spacers Right 02/23/2013    Procedure: EXCISIONAL TOTAL KNEE ARTHROPLASTY WITH ANTIBIOTIC SPACERS;  Surgeon: Cammy Copa, MD;  Location: [facility] OR;  Service: Orthopedics;  Laterality: Right;  Right Total Knee Arthroplasty Componenet Removal, Antibiotic Spacer Placement.  . Tonsillectomy      No prescriptions prior to admission   No Known Allergies  History  Substance Use Topics  . Smoking status: Former Games developer  . Smokeless tobacco: Never Used  . Alcohol Use: Yes     Comment: socially    Family History  Problem Relation Age of Onset  . Coronary artery disease Father   . Hypertension Mother       Review of Systems  Constitutional: Negative.   HENT: Negative.   Eyes: Negative.   Respiratory: Negative.   Cardiovascular: Negative.   Gastrointestinal: Negative.   Genitourinary: Negative.   Musculoskeletal: Positive for joint pain.  Skin: Negative.   Neurological: Negative.   Endo/Heme/Allergies: Negative.   Psychiatric/Behavioral: Negative.      Objective:  Physical Exam  Constitutional: He appears well-developed.  HENT:  Head: Normocephalic.  Eyes: Pupils are equal, round, and reactive to  light.  Neck: Normal range of motion.  Cardiovascular: Normal rate.   Respiratory: Effort normal.  GI: Soft.  Skin: Skin is warm.  Psychiatric: He has a normal mood and affect.   examination the right knee demonstrates well-healed surgical incision no effusion range of motion is 0-40 collaterals are stable pedal pulses intact minimal swelling present in the right lower extremity no proximal lymphadenopathy  Vital signs in last 24 hours:    Labs:  Estimated body mass  index is 37.79 kg/(m^2) as calculated from the following:   Height as of 04/12/13: 5\' 9"  (1.753 m).   Weight as of 04/12/13: 116.121 kg (256 lb).  Imaging Review Plain radiographs demonstrate severe degenerative joint disease of the right knee(s). The overall alignment is neutral.There is evidence of loosening of the femoral, tibial and patellar components. The bone quality appears to be fair for age and reported activity level. There is absence of femoral and tibial components currently with antibiotic spacer placement in position.  Assessment/Plan:  End stage arthritis, right knee(s) with failed previous arthroplasty.   The patient history, physical examination, clinical judgment of the provider and imaging studies are consistent with end stage degenerative joint disease of the right knee(s), previous total knee arthroplasty. Revision total knee arthroplasty is deemed medically necessary. The treatment options including medical management, injection therapy, arthroscopy and revision arthroplasty were discussed at length. The risks and benefits of revision total knee arthroplasty were presented and reviewed. The risks due to aseptic loosening, infection, stiffness, patella tracking problems, thromboembolic complications and other imponderables were discussed. The patient acknowledged the explanation, agreed to proceed with the plan and consent was signed. Patient is being admitted for inpatient treatment for surgery, pain control, PT, OT, prophylactic antibiotics, VTE prophylaxis, progressive ambulation and ADL's and discharge planning.The patient is planning to be discharged to skilled nursing facility  the patient had a strep infection. History is appropriately with a six-week course of IV antibiotics and implant removal. Plan this time is for antibiotic spacer removal pathologic assessment of tissue at the time with either reimplantation revision components with antibiotic cement or reimplantation of  anastomotic spacer with continuation of IV antibiotics. We'll make the assessment at the time of surgery as to which course we will follow risk and benefits including the 2 different potential postop courses are discussed all questions answered

## 2013-05-18 NOTE — Brief Op Note (Signed)
05/18/2013  4:01 PM  PATIENT:  Casey Donaldson.  67 y.o. male  PRE-OPERATIVE DIAGNOSIS:  RIGHT TOTAL KNEE ARTHROPLASTY INFECTION  POST-OPERATIVE DIAGNOSIS:  Right Total Knee Arthroplasty infection  PROCEDURE:  Procedure(s): TOTAL KNEE REVISION  SURGEON:  Surgeon(s): Cammy Copa, MD  ASSISTANT: s vernon pa  ANESTHESIA:   epidural and spinal  EBL: 250 ml    Total I/O In: 2000 [I.V.:2000] Out: 1250 [Urine:1050; Blood:200]  BLOOD ADMINISTERED: none  DRAINS: none   LOCAL MEDICATIONS USED:  none  SPECIMEN:  Cultures x 3 - -path tissue x 2 no acute inflammation  COUNTS:  YES  TOURNIQUET:   Total Tourniquet Time Documented: Thigh (Right) - 85 minutes Thigh (Right) - 30 minutes Total: Thigh (Right) - 115 minutes   DICTATION: .Other Dictation: Dictation Number [id number]  PLAN OF CARE: Admit to inpatient   PATIENT DISPOSITION:  PACU - hemodynamically stable

## 2013-05-18 NOTE — Anesthesia Postprocedure Evaluation (Signed)
  Anesthesia Post-op Note  Patient: Casey Donaldson.  Procedure(s) Performed: Procedure(s) with comments: TOTAL KNEE REVISION (Right) - REMOVAL OF CEMENT SPACER, REINPLANTATION OF REVISION TOTAL KNEE ARTHROPLASTY  Patient Location: PACU  Anesthesia Type:Spinal and Epidural  Level of Consciousness: awake, alert  and oriented  Airway and Oxygen Therapy: Patient Spontanous Breathing  Post-op Pain: none  Post-op Assessment: Post-op Vital signs reviewed, Patient's Cardiovascular Status Stable and Respiratory Function Stable  Post-op Vital Signs: Reviewed and stable  Complications: No apparent anesthesia complications

## 2013-05-18 NOTE — Transfer of Care (Addendum)
Immediate Anesthesia Transfer of Care Note  Patient: Dutch Ing.  Procedure(s) Performed: Procedure(s) with comments: TOTAL KNEE REVISION (Right) - REMOVAL OF CEMENT SPACER, REINPLANTATION OF REVISION TOTAL KNEE ARTHROPLASTY  Patient Location: PACU  Anesthesia Type:Spinal and Epidural  Level of Consciousness: awake, alert  and oriented  Airway & Oxygen Therapy: Patient Spontanous Breathing and Patient connected to nasal cannula oxygen  Post-op Assessment: Report given to PACU RN, Post -op Vital signs reviewed and stable and Patient able to wiggle toes, sensation at dermatome L2. Moving upper extremities well.   Post vital signs: Reviewed and stable  Complications: No apparent anesthesia complications

## 2013-05-18 NOTE — Progress Notes (Signed)
Dr Chaney Malling notified SB 70's to 80's, SR rate in 80's. Pt is awake alert, mentating clearly, no c/o. New order for albumin if SBP <70. Will cont to monitor closely.

## 2013-05-18 NOTE — Progress Notes (Signed)
Call to [facility] in Blood Bank, confirmed pt. Needs new blood bank specimen  due to antibodies.

## 2013-05-18 NOTE — Anesthesia Preprocedure Evaluation (Signed)
Anesthesia Evaluation  Patient identified by MRN, date of birth, ID band Patient awake    Reviewed: Allergy & Precautions, H&P , NPO status , Patient's Chart, lab work & pertinent test results  Airway Mallampati: III TM Distance: <3 FB Neck ROM: full    Dental   Pulmonary sleep apnea , former smoker,          Cardiovascular hypertension, + Valvular Problems/Murmurs     Neuro/Psych    GI/Hepatic GERD-  ,  Endo/Other  obese  Renal/GU      Musculoskeletal   Abdominal   Peds  Hematology   Anesthesia Other Findings   Reproductive/Obstetrics                           Anesthesia Physical Anesthesia Plan  ASA: III  Anesthesia Plan: Combined Spinal and Epidural and MAC   Post-op Pain Management:    Induction: Intravenous  Airway Management Planned: Simple Face Mask  Additional Equipment:   Intra-op Plan:   Post-operative Plan:   Informed Consent: I have reviewed the patients History and Physical, chart, labs and discussed the procedure including the risks, benefits and alternatives for the proposed anesthesia with the patient or authorized representative who has indicated his/her understanding and acceptance.     Plan Discussed with: CRNA, Anesthesiologist and Surgeon  Anesthesia Plan Comments:         Anesthesia Quick Evaluation

## 2013-05-18 NOTE — Progress Notes (Signed)
Orthopedic Tech Progress Note Patient Details:  Casey Donaldson 07/03/1945 [id number]  CPM Right Knee CPM Right Knee: On Right Knee Flexion (Degrees): 45 Right Knee Extension (Degrees): 0 Additional Comments: put on ohf   Jennye Moccasin 05/18/2013, 4:51 PM

## 2013-05-18 NOTE — Consult Note (Signed)
PHARMACY CONSULT NOTE  Pharmacy Consult:  Coumadin Indication: VTE prophylaxis s/p R-TK Revision due to R-TKA infection  Allergies: No Known Allergies  Height/Weight: Height: 5\' 9"  (175.3 cm) Weight: 277 lb 9 oz (125.9 kg) IBW/kg (Calculated) : 70.7 Dosing weight 119 kg  Vital Signs: BP 124/61  Pulse 78  Temp(Src) 97.6 F (36.4 C) (Oral)  Resp 18  Ht 5\' 9"  (1.753 m)  Wt 277 lb 9 oz (125.9 kg)  BMI 40.97 kg/m2  SpO2 97%  Active Problems: Active Problems:   Infection of prosthetic knee joint  Labs:  Recent Labs  05/18/13 0925  APTT 29  LABPROT 13.4  INR 1.04   Lab Results  Component Value Date   INR 1.04 05/18/2013   INR 2.27* 05/06/2013   INR 1.72* 02/26/2013   Estimated Creatinine Clearance: 119.2 ml/min (by C-G formula based on Cr of 0.8).  Medical / Surgical History: Past Medical History  Diagnosis Date  . Hypertension   . Heart murmur   . Sleep apnea     cpap  . Cancer     melanoma  . Joint pain     ankles and wrists  . Joint swelling     wrists and ankles  . GERD (gastroesophageal reflux disease)     takes OTC meds  . High cholesterol   . Complication of anesthesia     Pt reports dysphagia after last surgery. Pt. family reports his memory was effected by anesth. as well, for several weeks. Pt. also reports that there has been use of  a regional anesthesia in the past & he experienced a change in his genitals   Past Surgical History  Procedure Laterality Date  . Eye surgery      left eye  . Arthroscopic knee Left   . Joint replacement Right   . Excisional total knee arthroplasty with antibiotic spacers Right 02/23/2013    Procedure: EXCISIONAL TOTAL KNEE ARTHROPLASTY WITH ANTIBIOTIC SPACERS;  Surgeon: Cammy Copa, MD;  Location: [facility] OR;  Service: Orthopedics;  Laterality: Right;  Right Total Knee Arthroplasty Componenet Removal, Antibiotic Spacer Placement.  . Tonsillectomy      Medications:  Prescriptions prior to admission   Medication Sig Dispense Refill  . enalapril (VASOTEC) 20 MG tablet Take 20 mg by mouth 2 (two) times daily.      . felodipine (PLENDIL) 5 MG 24 hr tablet Take 5 mg by mouth every morning.      . fluticasone (FLONASE) 50 MCG/ACT nasal spray Place 2 sprays into both nostrils at bedtime.      . hydrochlorothiazide (MICROZIDE) 12.5 MG capsule Take 12.5 mg by mouth daily.      . Lactobacillus Rhamnosus, GG, (CULTURELLE PO) Take 1 capsule by mouth daily.      . rosuvastatin (CRESTOR) 20 MG tablet Take 20 mg by mouth at bedtime.      Marland Kitchen warfarin (COUMADIN) 1 MG tablet Take 2 mg by mouth daily. Take with (2) 5 mg tablets to equal 12 mg dose.      . warfarin (COUMADIN) 5 MG tablet Take 10 mg by mouth daily. Take with (2) 1 mg tablets to equal 12 mg.       Scheduled:  . [START ON 05/19/2013] atorvastatin  10 mg Oral q1800  .  ceFAZolin (ANCEF) IV  2 g Intravenous Q8H  . celecoxib  200 mg Oral Q12H  . docusate sodium  100 mg Oral BID  . enalapril  20 mg Oral BID  . [  START ON 05/19/2013] felodipine  5 mg Oral q morning - 10a  . fentaNYL      . fentaNYL      . fluticasone  2 spray Each Nare QHS  . hydrochlorothiazide  12.5 mg Oral Daily  . HYDROmorphone      . HYDROmorphone      . lidocaine  10 mL Other Once  . methocarbamol      . oxyCODONE       Anti-infectives   Start     Dose/Rate Route Frequency Ordered Stop   05/18/13 2200  ceFAZolin (ANCEF) IVPB 2 g/50 mL premix     2 g 100 mL/hr over 30 Minutes Intravenous 3 times per day 05/18/13 2004 05/20/13 2159   05/18/13 1545  ceFAZolin (ANCEF) IVPB 2 g/50 mL premix  Status:  Discontinued     2 g 100 mL/hr over 30 Minutes Intravenous  Once 05/18/13 1532 05/18/13 1904   05/18/13 1310  vancomycin (VANCOCIN) powder  Status:  Discontinued       As needed 05/18/13 1310 05/18/13 1601   05/18/13 1309  gentamicin (GARAMYCIN) injection  Status:  Discontinued       As needed 05/18/13 1310 05/18/13 1601   05/18/13 0909  ceFAZolin (ANCEF) IVPB 2 g/50 mL  premix     2 g 100 mL/hr over 30 Minutes Intravenous On call to O.R. 05/18/13 0909 05/18/13 1539   05/18/13 0600  ceFAZolin (ANCEF) IVPB 2 g/50 mL premix  Status:  Discontinued     2 g 100 mL/hr over 30 Minutes Intravenous On call to O.R. 05/17/13 1607 05/18/13 1904      Assessment:  67 y.o.male s/p R-total knee revision due to a R-TKA infection.  Patient was on chronic Coumadin PTA with last dose reported on 05/10/13.    Home dose of Coumadin reported as 12 mg daily.  Goal of Therapy:   INR 2-3      Plan:  1. Resume Coumadin today.  Coumadin 12 mg po x 1. 2. Daily INR's, CBC.  Monitor for bleeding complications.   Cassundra Mckeever, Elisha Headland,  Pharm.D.. 05/18/2013,  8:24 PM

## 2013-05-18 NOTE — Anesthesia Procedure Notes (Addendum)
Epidural Patient location during procedure: OR Start time: 05/18/2013 11:30 AM End time: 05/18/2013 11:43 AM  Staffing Anesthesiologist: HODIERNE, ADAM Performed by: anesthesiologist   Preanesthetic Checklist Completed: patient identified, site marked, surgical consent, pre-op evaluation, timeout performed, IV checked, risks and benefits discussed and monitors and equipment checked  Epidural Patient position: sitting Prep: Betadine Patient monitoring: heart rate, cardiac monitor, continuous pulse ox and blood pressure Approach: midline Injection technique: LOR air  Needle:  Needle type: Tuohy  Needle gauge: 16 G Needle length: 9 cm Needle insertion depth: 6 cm Catheter type: closed end flexible Catheter at skin depth: 15 cm Test dose: 2% lidocaine with Epi 1:200 K and negative  Assessment Sensory level: T6 Events: blood not aspirated, injection not painful and no injection resistance  Additional Notes This was a combined spinal and epidural procedure.  Once loss of resistance was found with the tuohy needle, a 25g spinal needle was introduced through the tuohy and CSF was located.  1.38mL of 0.75% bupivacaine was injected intrathecally.  The epidural catheter was then placed and secured without difficulty.  Pt tolerated the procedure well.Reason for block:surgical anesthesia  Date/Time: 05/18/2013 11:30 AM Performed by: Orvilla Fus A Oxygen Delivery Method: Simple face mask Placement Confirmation: positive ETCO2

## 2013-05-19 LAB — BASIC METABOLIC PANEL
BUN: 15 mg/dL (ref 6–23)
CO2: 24 mEq/L (ref 19–32)
Calcium: 9.6 mg/dL (ref 8.4–10.5)
Chloride: 104 mEq/L (ref 96–112)
Creatinine, Ser: 0.69 mg/dL (ref 0.50–1.35)
GFR calc Af Amer: >90 mL/min (ref >90)
GFR calc non Af Amer: >90 mL/min (ref >90)
Glucose, Bld: 123 mg/dL — ABNORMAL HIGH (ref 70–99)

## 2013-05-19 LAB — CBC
HCT: 26.6 % — ABNORMAL LOW (ref 39.0–52.0)
Hemoglobin: 8.9 g/dL — ABNORMAL LOW (ref 13.0–17.0)
MCH: 29.8 pg (ref 26.0–34.0)
MCV: 89 fL (ref 78.0–100.0)
RBC: 2.99 MIL/uL — ABNORMAL LOW (ref 4.22–5.81)
RDW: 14.4 % (ref 11.5–15.5)
WBC: 9.9 10*3/uL (ref 4.0–10.5)

## 2013-05-19 LAB — PROTIME-INR: INR: 1.16 (ref 0.00–1.49)

## 2013-05-19 MED ORDER — HYDROCODONE-ACETAMINOPHEN 5-325 MG PO TABS
1.0000 | ORAL_TABLET | ORAL | Status: DC | PRN
  Administered 2013-05-20 – 2013-05-21 (×2): 2 via ORAL
  Filled 2013-05-19 (×2): qty 2

## 2013-05-19 MED ORDER — WARFARIN SODIUM 6 MG PO TABS
12.0000 mg | ORAL_TABLET | Freq: Once | ORAL | Status: AC
  Administered 2013-05-19: 12 mg via ORAL
  Filled 2013-05-19: qty 2

## 2013-05-19 NOTE — Progress Notes (Signed)
Subjective: Pt stable - pain controlled   Objective: Vital signs in last 24 hours: Temp:  [97.3 F (36.3 C)-98.4 F (36.9 C)] 97.8 F (36.6 C) (12/03 0604) Pulse Rate:  [57-90] 74 (12/03 0604) Resp:  [10-21] 18 (12/03 0739) BP: (80-146)/(49-79) 139/60 mmHg (12/03 0604) SpO2:  [91 %-100 %] 100 % (12/03 0604) Weight:  [125.9 kg (277 lb 9 oz)] 125.9 kg (277 lb 9 oz) (12/02 1910)  Intake/Output from previous day: 12/02 0701 - 12/03 0700 In: 2450 [P.O.:100; I.V.:2350] Out: 2300 [Urine:2100; Blood:200] Intake/Output this shift:    Exam:  Neurovascular intact Sensation intact distally Intact pulses distally  Labs:  Recent Labs  05/19/13 0445  HGB 8.9*    Recent Labs  05/19/13 0445  WBC 9.9  RBC 2.99*  HCT 26.6*  PLT 228    Recent Labs  05/19/13 0445  NA 137  K 4.6  CL 104  CO2 24  BUN 15  CREATININE 0.69  GLUCOSE 123*  CALCIUM 9.6    Recent Labs  05/18/13 0925 05/19/13 0445  INR 1.04 1.16    Assessment/Plan: Plan CPM and PT today Pt is clearer post op than last time with component removal   DEAN,GREGORY SCOTT 05/19/2013, 7:57 AM

## 2013-05-19 NOTE — Evaluation (Signed)
Physical Therapy Evaluation Patient Details Name: Casey Donaldson. MRN: [id number] DOB: 1945-06-18 Today's Date: 05/19/2013 Time: 4098-1191 PT Time Calculation (min): 40 min  PT Assessment / Plan / Recommendation History of Present Illness  Pt. underwent R sx secondary to infection. Right total knee infection, status post antibiotic spacer placement  Clinical Impression  This patient presents with acute pain and decreased functional independence following the above mentioned procedure. At the time of PT eval, pt required min assist to min guard for functional mobility and ambulation. He has a platform walker at home due to difficulty bearing increased weight through RUE. His wife is to bring it in so we can look at it and possibly take platform off walker now that he is WBAT. This patient is appropriate for skilled PT interventions to address functional limitations, improve safety and independence with functional mobility, and return to PLOF.     PT Assessment  Patient needs continued PT services    Follow Up Recommendations  Home health PT    Does the patient have the potential to tolerate intense rehabilitation      Barriers to Discharge        Equipment Recommendations  None recommended by PT    Recommendations for Other Services     Frequency 7X/week    Precautions / Restrictions Precautions Precautions: Knee;Fall Required Braces or Orthoses: Knee Immobilizer - Right Knee Immobilizer - Right: On when out of bed or walking Restrictions Weight Bearing Restrictions: Yes RLE Weight Bearing: Weight bearing as tolerated   Pertinent Vitals/Pain Pt reports 5/10 pain at rest      Mobility  Bed Mobility Bed Mobility: Supine to Sit;Sitting - Scoot to Edge of Bed Supine to Sit: 4: Min assist;HOB flat;With rails Sitting - Scoot to Edge of Bed: 4: Min guard;With rail Details for Bed Mobility Assistance: VC's for sequencing and technique. Heavy railing use and min assist  to support and move RLE to EOB. Transfers Transfers: Sit to Stand;Stand to Sit Sit to Stand: 4: Min guard;From bed;With upper extremity assist Stand to Sit: 4: Min guard;To chair/3-in-1;With upper extremity assist Details for Transfer Assistance: VC's for hand placement and safety awareness. Elevated bed height to simulate bed height at home. Ambulation/Gait Ambulation/Gait Assistance: 4: Min guard Ambulation Distance (Feet): 30 Feet Assistive device: Rolling walker Ambulation/Gait Assistance Details: VC's for sequencing and safety awareness with the RW.  Gait Pattern: Step-to pattern;Decreased stride length;Trunk flexed Gait velocity: Decreased    Exercises Total Joint Exercises Ankle Circles/Pumps: 10 reps Quad Sets: 10 reps Heel Slides: 10 reps Hip ABduction/ADduction: 10 reps Goniometric ROM: 5-67   PT Diagnosis: Difficulty walking;Acute pain  PT Problem List: Decreased strength;Decreased range of motion;Decreased activity tolerance;Decreased balance;Decreased mobility;Decreased knowledge of use of DME;Decreased safety awareness;Pain PT Treatment Interventions: DME instruction;Gait training;Stair training;Functional mobility training;Therapeutic activities;Therapeutic exercise;Neuromuscular re-education;Patient/family education     PT Goals(Current goals can be found in the care plan section) Acute Rehab PT Goals Patient Stated Goal: To get back to his walking program PT Goal Formulation: With patient Time For Goal Achievement: 05/26/13 Potential to Achieve Goals: Good  Visit Information  Last PT Received On: 05/19/13 Assistance Needed: +1 History of Present Illness: Pt. underwent R sx secondary to infection. Right total knee infection, status post antibiotic spacer placement       Prior Functioning  Home Living Family/patient expects to be discharged to:: Private residence Living Arrangements: Spouse/significant other Available Help at Discharge: Family;Available 24  hours/day Type of Home: House Home Access: Ramped  entrance Home Layout: Two level;Able to live on main level with bedroom/bathroom Home Equipment: Dan Humphreys - 2 wheels;Wheelchair - Recruitment consultant Equipment: Reacher;Sock aid;Other (Comment) (Leg lifter) Additional Comments: Pt has 2 steps to enter house with handrails  Prior Function Level of Independence: Independent Comments: pt was independent prior to 1 week ago when pain increased and pt was 2+ (A)  Communication Communication: No difficulties Dominant Hand: Right    Cognition  Cognition Arousal/Alertness: Awake/alert Behavior During Therapy: WFL for tasks assessed/performed Overall Cognitive Status: Within Functional Limits for tasks assessed    Extremity/Trunk Assessment Upper Extremity Assessment Upper Extremity Assessment: Defer to OT evaluation Lower Extremity Assessment Lower Extremity Assessment: RLE deficits/detail RLE Deficits / Details: Decreased strength and AROM consistent with total knee revision RLE: Unable to fully assess due to pain Cervical / Trunk Assessment Cervical / Trunk Assessment: Normal   Balance Balance Balance Assessed: No Static Sitting Balance Static Sitting - Balance Support: No upper extremity supported;Feet supported Static Standing Balance Static Standing - Balance Support: No upper extremity supported  End of Session PT - End of Session Equipment Utilized During Treatment: Gait belt;Right knee immobilizer Activity Tolerance: Patient tolerated treatment well Patient left: in chair;with call bell/phone within reach Nurse Communication: Mobility status CPM Right Knee CPM Right Knee: Off  GP     Ruthann Cancer 05/19/2013, 12:15 PM  Ruthann Cancer, PT, DPT [phone]

## 2013-05-19 NOTE — Progress Notes (Signed)
ANTICOAGULATION CONSULT NOTE - Follow Up Consult  Pharmacy Consult for Warfarin Indication: VTE prophylaxis s/p R-TKR on 12/2  No Known Allergies  Patient Measurements: Height: 5\' 9"  (175.3 cm) Weight: 277 lb 9 oz (125.9 kg) IBW/kg (Calculated) : 70.7  Vital Signs: Temp: 97.8 F (36.6 C) (12/03 0604) BP: 139/60 mmHg (12/03 0604) Pulse Rate: 74 (12/03 0604)  Labs:  Recent Labs  05/18/13 0925 05/19/13 0445  HGB  --  8.9*  HCT  --  26.6*  PLT  --  228  APTT 29  --   LABPROT 13.4 14.6  INR 1.04 1.16  CREATININE  --  0.69    Estimated Creatinine Clearance: 119.2 ml/min (by C-G formula based on Cr of 0.69).   Assessment: 67 y.o. M who continues on warfarin for VTE prophylaxis s/p R-TKR on 12/2. The patient was previously on warfarin since his surgery in September at which time he underwent a R-TKA with antibiotic spacers placed. PTA the patient was therapeutic on a dose of 12 mg daily.  INR today remains SUBtherapeutic (INR 1.16 << 1.04, goal of 2-3). Hgb 8.9, plts wnl - no overt s/sx of bleeding noted. The patient and his wife were re-educated on warfarin today.   Goal of Therapy:  INR 2-3   Plan:  1. Warfarin 12 mg x 1 dose at 1800 today 2. Will continue to monitor for any signs/symptoms of bleeding and will follow up with PT/INR in the a.m.   Georgina Pillion, PharmD, BCPS Clinical Pharmacist Pager: [phone] 05/19/2013 1:55 PM

## 2013-05-19 NOTE — Op Note (Addendum)
NAME:  Casey Donaldson, Casey Donaldson NO.:  [id number]  MEDICAL RECORD NO.:  [id number]  LOCATION:  [id number]                        FACILITY:  [facility]  PHYSICIAN:  Burnard Bunting, M.D.    DATE OF BIRTH:  1946-01-12  DATE OF PROCEDURE: DATE OF DISCHARGE:                              OPERATIVE REPORT   PREOPERATIVE DIAGNOSIS:  Right total knee infection, status post antibiotic spacer placement.  PREOPERATIVE DIAGNOSIS:  Right total knee infection, status post antibiotic spacer placement.  PROCEDURE:  Right total knee arthroplasties, antibiotic spacer removal, extensive debridement, total knee arthroplasty revision with DePuy size 3 femur, 3 tibia, 25 poly, 115-mm press-fit stem on the tibia, 75-mm press-fit stem on the femur with antibiotic gentamicin, impregnated Palacos cement utilized along with Stimulan antibiotic beads.  SURGEON:  Burnard Bunting, M.D.  ASSISTANT:  [name], P.A.  ANESTHESIA:  General endotracheal.  ESTIMATED BLOOD LOSS:  250 mL.  DRAINS:  None.  CULTURES:  X3 made, pathologic tissue x2, sent to Pathology with no acute inflammation seen.  PROCEDURE IN DETAIL:  The patient was brought to the operating room where general endotracheal anesthesia was induced.  Preoperative antibiotics were administered.  Time-out was called.  Right leg was prescrubbed with alcohol and Betadine, which was allowed to air dry, prepped with DuraPrep solution, and draped in a sterile manner.  [name] was used to cover the operative field.  Time-out was called.  Leg was elevated and exsanguinated with Esmarch wrap.  Tourniquet was inflated for 85 minutes.  During this time, anterior approach to the knee was made.  Skin and subcutaneous tissue were sharply divided.  Fluid and tissue specimens were sent for culture as well as sent to Pathology, no acute inflammation was seen, less than 10 polys per high-power field. Complete synovectomy was performed.  Cement spacers were  removed.  All soft tissue and bony surfaces were curetted and scraped, and removed of tissue.  Care was taken to avoid injury to posterior neurovascular structures, significant bone loss present on the tibia.  At this time, the tibia was prepared, intramedullary guide was used, freshen up cut was made on the tibia; however, there was significant bone loss, but was a contained defect.  For this reason, it was elected to use a press-fit stem and press-fit sleeve.  At this time, the canal was broached to accept 34-mm sleeve and stem, which was 115 mm.  Because the patient had previously cemented stem, I was elected to proceed with press-fit stem, although I made potentially using a thicker poly insert.  At this time, tourniquet was released.  Bleeding points were encountered and controlled with electrocautery.  Thorough irrigation was performed using 6 liters of irrigating solution.  Tourniquet remained down until cementation was performed.  The femur was then prepared.  The patient had AP diameter, was size 3, medial and lateral size 4.  It was elected to proceed with size 3 for optimal stability using also a press-fit stem up to size 20.  With the stem in position, the patient was going to require 8-mm posterior augments.  Bone loss was significant on the posterior aspect of both condyles.  Box cut and chamfer cuts were made.  Trial stem was placed.  Trial stem was also placed on the tibia and the patient was achieved full extension with a 22.5-mm spacer.  Implants were removed.  Tourniquet was again inflated for total of 30 minutes for total of 115 minutes.  Thorough irrigation was again performed with 6 liters of irrigating solution.  No necrotic-appearing or devitalized- appearing tissue remained.  The Stimulan beads were then placed in both canals and posteriorly.  Three tray with a press-fit stem and a rotational control sleeve was placed with good coverage.  In a similar fashion, the  femur was placed.  There was not enough patella left. Laterally performed on the patella in order to improve tracking.  Excess cement was removed.  The cement utilized was Palacos with impregnated gentamicin.  The patient achieved full extension and had good stability, varus or valgus stress at 0, 30 and 90 degrees with a 25 spacer, which was the one chosen.  Prior to this being placed, Stimulan beads were placed in the posterior aspect of the capsule.  Tourniquet was again released.  Bleeding points were encountered and controlled with electrocautery.  Arthrotomy was closed using #1 Vicryl suture, 0 Vicryl suture, 2-0 Vicryl suture and skin staples, and utilization of spinal epidural for anesthesia.  He tolerated the procedure well without immediate complication.  Transferred to the recovery room in stable condition.  [name]'s assistance was required at all times for opening, closing, implant positioning, and protection of neurovascular structures.  Her assistance was a medical necessity. The soft tissue removed was the hypertrophic synovium within the knee joint. It was removed with the scalpel and curette.     Burnard Bunting, M.D.     GSD/MEDQ  D:  05/18/2013  T:  05/19/2013  Job:  [id number]

## 2013-05-19 NOTE — Progress Notes (Signed)
UR review completed. 

## 2013-05-19 NOTE — Progress Notes (Signed)
Nutrition Brief Note  Patient identified on the Malnutrition Screening Tool (MST) Report  Pt reports that he lost weight after his knee replacement due to decreased appetite. Pt's appetite is now normal. Pt consumed 100% of his meals. Discussed using nutrition supplements at home if he were to have any further issues. No need for any intervention at this time.   Wt Readings from Last 15 Encounters:  05/18/13 277 lb 9 oz (125.9 kg)  05/18/13 277 lb 9 oz (125.9 kg)  05/06/13 262 lb 8 oz (119.069 kg)  04/12/13 256 lb (116.121 kg)  03/23/13 282 lb (127.914 kg)  02/22/13 290 lb (131.543 kg)  02/22/13 290 lb (131.543 kg)    Body mass index is 40.97 kg/(m^2). Patient meets criteria for Extreme Obesity Class III based on current BMI.   Current diet order is Regular, patient is consuming approximately 100% of meals at this time. Labs and medications reviewed.   No nutrition interventions warranted at this time. If nutrition issues arise, please consult RD.   Kendell Bane RD, LDN, CNSC [phone] Pager [phone] After Hours Pager

## 2013-05-19 NOTE — Care Management Note (Signed)
Patient preoperatively setup with Gentiva HC, no changes. Patient has rolling walker, 3in1. CPM has been delivered to the patient's home.Casey Peper, RN BSN

## 2013-05-19 NOTE — Progress Notes (Signed)
Physical Therapy Treatment Patient Details Name: Casey Donaldson. MRN: [id number] DOB: 06-Jul-1945 Today's Date: 05/19/2013 Time: 7829-5621 PT Time Calculation (min): 39 min  PT Assessment / Plan / Recommendation  History of Present Illness Pt. underwent R sx secondary to infection. Right total knee infection, status post antibiotic spacer placement   PT Comments   Pt progressing well towards physical therapy goals. Wife brought walker in, and therapist removed platform. Pt was able to use RW without wrist pain, and anticipate that walker without platform will be fine. Pt concerned about progress as he has not been able to exercise much since September, however pt had good rehab effort and was able to progress ambulation with improved technique.  Follow Up Recommendations  Home health PT     Does the patient have the potential to tolerate intense rehabilitation     Barriers to Discharge        Equipment Recommendations  None recommended by PT    Recommendations for Other Services    Frequency 7X/week   Progress towards PT Goals Progress towards PT goals: Progressing toward goals  Plan Current plan remains appropriate    Precautions / Restrictions Precautions Precautions: Knee;Fall Precaution Comments: Reviewed towel roll placement and no pillow under knee Required Braces or Orthoses: Knee Immobilizer - Right Knee Immobilizer - Right: On when out of bed or walking Restrictions Weight Bearing Restrictions: Yes RLE Weight Bearing: Weight bearing as tolerated   Pertinent Vitals/Pain Pt reports 3/10 pain at worst during session.    Mobility  Bed Mobility Bed Mobility: Supine to Sit;Sitting - Scoot to [place] of Bed;Sit to Supine;Scooting to [facility] Supine to Sit: 4: Min assist;HOB flat;With rails Sitting - Scoot to Edge of Bed: 4: Min guard;With rail Sit to Supine: 4: Min assist;HOB flat;With rail Scooting to [facility]: With trapeze;5: Set up Details for Bed Mobility Assistance:  VC's for sequencing and technique. Min assist to support and move RLE. Transfers Transfers: Sit to Stand;Stand to Sit Sit to Stand: 4: Min guard;From bed;With upper extremity assist Stand to Sit: 4: Min guard;To bed Details for Transfer Assistance: VC's for hand placement and safety awareness. Elevated bed height to simulate bed height at home. Ambulation/Gait Ambulation/Gait Assistance: 4: Min guard Ambulation Distance (Feet): 75 Feet Assistive device: Rolling walker Ambulation/Gait Assistance Details: VC's for sequencing, increased heel strike and increased step length on L to encourage step-through gait pattern Gait Pattern: Step-to pattern;Decreased stride length;Narrow base of support Gait velocity: Decreased    Exercises Total Joint Exercises Ankle Circles/Pumps: 10 reps Quad Sets: 10 reps Towel Squeeze: 10 reps Short Arc Quad: 10 reps Heel Slides: 10 reps Hip ABduction/ADduction: 10 reps Goniometric ROM: 5-67   PT Diagnosis: Difficulty walking;Acute pain  PT Problem List: Decreased strength;Decreased range of motion;Decreased activity tolerance;Decreased balance;Decreased mobility;Decreased knowledge of use of DME;Decreased safety awareness;Pain PT Treatment Interventions: DME instruction;Gait training;Stair training;Functional mobility training;Therapeutic activities;Therapeutic exercise;Neuromuscular re-education;Patient/family education   PT Goals (current goals can now be found in the care plan section) Acute Rehab PT Goals Patient Stated Goal: To get back to his walking program PT Goal Formulation: With patient Time For Goal Achievement: 05/26/13 Potential to Achieve Goals: Good  Visit Information  Last PT Received On: 05/19/13 Assistance Needed: +1 History of Present Illness: Pt. underwent R sx secondary to infection. Right total knee infection, status post antibiotic spacer placement    Subjective Data  Subjective: "I have been on the CPM for about an hour  now." Patient Stated Goal: To get back  to his walking program   Cognition  Cognition Arousal/Alertness: Awake/alert Behavior During Therapy: WFL for tasks assessed/performed Overall Cognitive Status: Within Functional Limits for tasks assessed    Balance  Balance Balance Assessed: No Static Sitting Balance Static Sitting - Balance Support: No upper extremity supported;Feet supported Static Standing Balance Static Standing - Balance Support: No upper extremity supported  End of Session PT - End of Session Equipment Utilized During Treatment: Gait belt;Right knee immobilizer Activity Tolerance: Patient tolerated treatment well Patient left: in bed;with call bell/phone within reach;with family/visitor present Nurse Communication: Mobility status CPM Right Knee CPM Right Knee: On Right Knee Flexion (Degrees): 40 Right Knee Extension (Degrees): 0   GP     Ruthann Cancer 05/19/2013, 2:46 PM  Ruthann Cancer, PT, DPT [phone]

## 2013-05-19 NOTE — Evaluation (Signed)
Occupational Therapy Evaluation Patient Details Name: Casey Donaldson. MRN: [id number] DOB: 07-01-1945 Today's Date: 05/19/2013 Time: 4098-1191 OT Time Calculation (min): 41 min  OT Assessment / Plan / Recommendation History of present illness Pt. underwent R sx secondary to infection. Right total knee infection, status post antibiotic spacer placement   Clinical Impression   Pt is 67 y/o male w/ dx as above. He presents w/ decreased independence w/ ADL's and functional transfers and should benefit from acute OT to assist in maximizing independence w/ LB ADL's and shower transfers prior to anticipated d/c home w/ family/spouse PRN assist.    OT Assessment  Patient needs continued OT Services    Follow Up Recommendations  No OT follow up    Barriers to Discharge      Equipment Recommendations  None recommended by OT (Pt has DME)    Recommendations for Other Services    Frequency  Min 2X/week    Precautions / Restrictions Precautions Precautions: Knee;Fall Restrictions Weight Bearing Restrictions: Yes RLE Weight Bearing: Weight bearing as tolerated   Pertinent Vitals/Pain 2/10 at rest; 5/10 w/ activity. Repositioned, pt reports having had pain medications prior to this session.    ADL  Eating/Feeding: Performed;Independent Where Assessed - Eating/Feeding: Chair Grooming: Performed;Wash/dry hands;Wash/dry face;Teeth care;Brushing hair;Supervision/safety (Standing at sink in bathroom) Where Assessed - Grooming: Supported standing;Unsupported standing Upper Body Bathing: Performed;Supervision/safety Where Assessed - Upper Body Bathing: Supported standing;Unsupported standing Lower Body Bathing: Simulated;Moderate assistance Where Assessed - Lower Body Bathing: Supported sit to stand Upper Body Dressing: Simulated;Set up Where Assessed - Upper Body Dressing: Unsupported sitting Lower Body Dressing: Performed;Moderate assistance Where Assessed - Lower Body Dressing:  Supported sit to Pharmacist, hospital: Performed;Supervision/safety (Ambulating into bathroom on/off comfort height toilet) Toilet Transfer Method: Sit to Barista: Comfort height toilet;Grab bars Toileting - Clothing Manipulation and Hygiene: Performed;Minimal assistance Where Assessed - Engineer, mining and Hygiene: Standing Tub/Shower Transfer Method: Not assessed Equipment Used: Rolling walker;Knee Immobilizer Transfers/Ambulation Related to ADLs: Pt supervision level for functional mobility and transfers into bathroom noted. ADL Comments: Pt was educated in role of OT, discussed knee precautions, KI, LB dressing and bathing. Pt has A/E at home and plans for wife to assist PRN. Stood at sink for grooming and bathing w/ supervision assist today.    OT Diagnosis: Generalized weakness;Acute pain  OT Problem List: Decreased activity tolerance;Decreased knowledge of use of DME or AE;Pain OT Treatment Interventions: Self-care/ADL training;DME and/or AE instruction;Patient/family education;Therapeutic activities   OT Goals(Current goals can be found in the care plan section) Acute Rehab OT Goals Patient Stated Goal: Home w/ wife/family PRN assist OT Goal Formulation: With patient Time For Goal Achievement: 05/26/13 Potential to Achieve Goals: Good  Visit Information  Last OT Received On: 05/19/13 Assistance Needed: +1 History of Present Illness: Pt. underwent R sx secondary to infection. Right total knee infection, status post antibiotic spacer placement       Prior Functioning     Home Living Family/patient expects to be discharged to:: Private residence Living Arrangements: Spouse/significant other Available Help at Discharge: Family;Available 24 hours/day Type of Home: House Home Access: Ramped entrance Home Layout: Two level;Able to live on main level with bedroom/bathroom Home Equipment: Dan Humphreys - 2 wheels;Wheelchair - manual;Tub  bench;Hand held shower head;Adaptive equipment Adaptive Equipment: Reacher;Sock aid;Other (Comment) (Leg lifter) Additional Comments: Pt has 2 steps to enter house with handrails  Prior Function Level of Independence: Independent Comments: pt was independent prior to 1 week ago when pain  increased and pt was 2+ (A)  Communication Communication: No difficulties Dominant Hand: Right    Vision/Perception Vision - History Baseline Vision: Wears glasses only for reading Patient Visual Report: Other (comment) (Has eye appointment this month and anticipates he will need new prescription)   Cognition  Cognition Arousal/Alertness: Awake/alert Behavior During Therapy: WFL for tasks assessed/performed Overall Cognitive Status: Within Functional Limits for tasks assessed    Extremity/Trunk Assessment Upper Extremity Assessment Upper Extremity Assessment: Overall WFL for tasks assessed Lower Extremity Assessment Lower Extremity Assessment: Defer to PT evaluation    Mobility Bed Mobility Bed Mobility: Not assessed (Pt up in chair) Transfers Transfers: Sit to Stand;Stand to Sit Sit to Stand: 5: Supervision;From chair/3-in-1;From toilet;With upper extremity assist;With armrests Stand to Sit: 5: Supervision;To chair/3-in-1;To toilet;With upper extremity assist Details for Transfer Assistance: Pt demonstrates good technique and hand placement.        Balance Balance Balance Assessed: Yes Static Sitting Balance Static Sitting - Balance Support: No upper extremity supported;Feet supported Static Standing Balance Static Standing - Balance Support: No upper extremity supported   End of Session OT - End of Session Equipment Utilized During Treatment: Rolling walker Activity Tolerance: Patient tolerated treatment well Patient left: in chair;with call bell/phone within reach Nurse Communication: Mobility status  GO     Roselie Awkward Dixon 05/19/2013, 11:13 AM

## 2013-05-20 ENCOUNTER — Encounter (HOSPITAL_COMMUNITY): Payer: Self-pay | Admitting: General Practice

## 2013-05-20 LAB — URINE CULTURE
Colony Count: NO GROWTH
Culture: NO GROWTH

## 2013-05-20 LAB — CBC
HCT: 25.1 % — ABNORMAL LOW (ref 39.0–52.0)
Hemoglobin: 8.6 g/dL — ABNORMAL LOW (ref 13.0–17.0)
MCH: 29.9 pg (ref 26.0–34.0)
MCHC: 34.3 g/dL (ref 30.0–36.0)
MCV: 87.2 fL (ref 78.0–100.0)
RBC: 2.88 MIL/uL — ABNORMAL LOW (ref 4.22–5.81)
RDW: 14.3 % (ref 11.5–15.5)

## 2013-05-20 LAB — PROTIME-INR: Prothrombin Time: 16 seconds — ABNORMAL HIGH (ref 11.6–15.2)

## 2013-05-20 MED ORDER — WARFARIN SODIUM 6 MG PO TABS
12.0000 mg | ORAL_TABLET | Freq: Once | ORAL | Status: AC
  Administered 2013-05-20: 12 mg via ORAL
  Filled 2013-05-20: qty 2

## 2013-05-20 NOTE — Progress Notes (Signed)
Physical Therapy Treatment Patient Details Name: Casey Donaldson. MRN: [id number] DOB: Apr 10, 1946 Today's Date: 05/20/2013 Time: 4098-1191 PT Time Calculation (min): 34 min  PT Assessment / Plan / Recommendation  History of Present Illness Pt. underwent R sx secondary to infection. Right total knee infection, status post antibiotic spacer placement   PT Comments   Pt progressing well towards physical therapy goals. Pt able to improve ambulation distance as well as gait pattern and technique.  Wife states that she is worried that pt will end up going back to a SNF at d/c, however PT anticipates home with home health will continue to be an appropriate d/c disposition.  Follow Up Recommendations  Home health PT     Does the patient have the potential to tolerate intense rehabilitation     Barriers to Discharge        Equipment Recommendations  None recommended by PT    Recommendations for Other Services    Frequency 7X/week   Progress towards PT Goals Progress towards PT goals: Progressing toward goals  Plan Current plan remains appropriate    Precautions / Restrictions Precautions Precautions: Knee;Fall Required Braces or Orthoses: Knee Immobilizer - Right Knee Immobilizer - Right: On when out of bed or walking Restrictions Weight Bearing Restrictions: Yes RLE Weight Bearing: Weight bearing as tolerated   Pertinent Vitals/Pain 3/10 at worst throughout session    Mobility  Bed Mobility Bed Mobility: Supine to Sit;Sitting - Scoot to Edge of Bed Supine to Sit: 4: Min assist;HOB flat;With rails Sitting - Scoot to Edge of Bed: 4: Min guard;With rail;Other (comment) (Trapeze) Details for Bed Mobility Assistance: VC's for sequencing and technique. Min assist to support and move RLE. Transfers Transfers: Sit to Stand;Stand to Sit Sit to Stand: 4: Min assist;With upper extremity assist;From bed Stand to Sit: 4: Min guard;To chair/3-in-1;With upper extremity assist Details  for Transfer Assistance: VC's for hand placement. Assist to come to full stand from the bed due to soft mattress to push up from.  Ambulation/Gait Ambulation/Gait Assistance: 4: Min guard Ambulation Distance (Feet): 100 Feet Assistive device: Rolling walker Ambulation/Gait Assistance Details: VC's for sequencing, increased step/stride length on L to encourage step-through gait pattern, as well as for walker placement and increased heel strike.  Gait Pattern: Step-to pattern;Step-through pattern;Decreased stride length Gait velocity: Decreased    Exercises Total Joint Exercises Quad Sets: 10 reps Towel Squeeze: 10 reps Short Arc Quad: 10 reps Heel Slides: 10 reps Hip ABduction/ADduction: 10 reps Goniometric ROM: 6-68 AAROM   PT Diagnosis:    PT Problem List:   PT Treatment Interventions:     PT Goals (current goals can now be found in the care plan section) Acute Rehab PT Goals Patient Stated Goal: To get back to his walking program PT Goal Formulation: With patient Time For Goal Achievement: 05/26/13 Potential to Achieve Goals: Good  Visit Information  Last PT Received On: 05/20/13 Assistance Needed: +1 History of Present Illness: Pt. underwent R sx secondary to infection. Right total knee infection, status post antibiotic spacer placement    Subjective Data  Subjective: "It's feeling better today." Patient Stated Goal: To get back to his walking program   Cognition  Cognition Arousal/Alertness: Awake/alert Behavior During Therapy: WFL for tasks assessed/performed Overall Cognitive Status: Within Functional Limits for tasks assessed    Balance     End of Session PT - End of Session Equipment Utilized During Treatment: Gait belt;Right knee immobilizer Activity Tolerance: Patient tolerated treatment well Patient left: in  chair;with call bell/phone within reach;with family/visitor present Nurse Communication: Mobility status CPM Right Knee CPM Right Knee: On Right  Knee Flexion (Degrees): 50 Right Knee Extension (Degrees): 0   GP     Ruthann Cancer 05/20/2013, 10:32 AM  Ruthann Cancer, PT, DPT [phone]

## 2013-05-20 NOTE — Progress Notes (Signed)
Physical Therapy Treatment Patient Details Name: Casey Donaldson. MRN: [id number] DOB: 05-27-1946 Today's Date: 05/20/2013 Time: 4098-1191 PT Time Calculation (min): 32 min  PT Assessment / Plan / Recommendation  History of Present Illness Pt. underwent R sx secondary to infection. Right total knee infection, status post antibiotic spacer placement   PT Comments   Pt progressing well towards physical therapy goals. Continues to have difficulty with quad activation during therapeutic exercise and functional mobility, but does well overall with the knee immobilizer on.   Follow Up Recommendations  Home health PT     Does the patient have the potential to tolerate intense rehabilitation     Barriers to Discharge        Equipment Recommendations  None recommended by PT    Recommendations for Other Services    Frequency 7X/week   Progress towards PT Goals Progress towards PT goals: Progressing toward goals  Plan Current plan remains appropriate    Precautions / Restrictions Precautions Precautions: Knee;Fall Precaution Comments: Reviewed towel roll placement and no pillow under knee Required Braces or Orthoses: Knee Immobilizer - Right Knee Immobilizer - Right: On when out of bed or walking Restrictions Weight Bearing Restrictions: Yes RLE Weight Bearing: Weight bearing as tolerated   Pertinent Vitals/Pain 3/10 at rest    Mobility  Bed Mobility Bed Mobility: Supine to Sit;Sitting - Scoot to Edge of Bed Supine to Sit: 4: Min assist;HOB flat;With rails Sitting - Scoot to Edge of Bed: 4: Min guard;With rail;Other (comment) (Trapeze) Details for Bed Mobility Assistance: VC's for sequencing and technique. Min assist to support and move RLE. Transfers Transfers: Sit to Stand;Stand to Sit Sit to Stand: 4: Min guard;With upper extremity assist;From bed Stand to Sit: 4: Min guard;To chair/3-in-1;With upper extremity assist Details for Transfer Assistance: VC's for hand  placement.  Ambulation/Gait Ambulation/Gait Assistance: 4: Min guard Ambulation Distance (Feet): 130 Feet Assistive device: Rolling walker Ambulation/Gait Assistance Details: VC's for improved posture, encouraged step-through gait pattern, and fluidity of walker movement. Gait Pattern: Step-to pattern;Step-through pattern;Decreased stride length Gait velocity: Decreased    Exercises Total Joint Exercises Quad Sets: 10 reps Towel Squeeze: 10 reps Heel Slides: 10 reps Hip ABduction/ADduction: 10 reps Straight Leg Raises: 5 reps Long Arc Quad: 10 reps   PT Diagnosis:    PT Problem List:   PT Treatment Interventions:     PT Goals (current goals can now be found in the care plan section) Acute Rehab PT Goals Patient Stated Goal: To get back to his walking program PT Goal Formulation: With patient Time For Goal Achievement: 05/26/13 Potential to Achieve Goals: Good  Visit Information  Last PT Received On: 05/20/13 Assistance Needed: +1 History of Present Illness: Pt. underwent R sx secondary to infection. Right total knee infection, status post antibiotic spacer placement    Subjective Data  Subjective: "I've been in the CPM for almost 2 hours." Patient Stated Goal: To get back to his walking program   Cognition  Cognition Arousal/Alertness: Awake/alert Behavior During Therapy: WFL for tasks assessed/performed Overall Cognitive Status: Within Functional Limits for tasks assessed    Balance     End of Session PT - End of Session Equipment Utilized During Treatment: Gait belt;Right knee immobilizer Activity Tolerance: Patient tolerated treatment well Patient left: in chair;with call bell/phone within reach;with family/visitor present Nurse Communication: Mobility status CPM Right Knee CPM Right Knee: On Right Knee Flexion (Degrees): 55 Right Knee Extension (Degrees): 0   GP     Ruthann Cancer  05/20/2013, 4:00 PM  Ruthann Cancer, PT, DPT [phone]

## 2013-05-20 NOTE — Progress Notes (Signed)
Occupational Therapy Treatment Patient Details Name: Casey Donaldson. MRN: [id number] DOB: [birth year] Today's Date: 05/20/2013 Time: 4098-1191 OT Time Calculation (min): 23 min  OT Assessment / Plan / Recommendation  History of present illness Pt. underwent R sx secondary to infection. Right total knee infection, status post antibiotic spacer placement   OT comments  Pt & his wife were educated in LB dressing, shower transfers and DME/a/e was reviewed. Pt/wife report that they have necessary a/e, DME at home and report no further needs from acute OT at this time. Pt plans to d/c home w/ 24/supervision/assist when medically able. Pt has met acute care goals at this time.  Follow Up Recommendations  No OT follow up;Supervision/Assistance - 24 hour    Barriers to Discharge       Equipment Recommendations  None recommended by OT (Has DME & a/e)    Recommendations for Other Services    Frequency Min 2X/week   Progress towards OT Goals Progress towards OT goals: Progressing toward goals;Goals met/education completed, patient discharged from OT  Plan Discharge plan remains appropriate    Precautions / Restrictions Precautions Precautions: Knee;Fall Precaution Comments: Reviewed towel roll placement and no pillow under knee Required Braces or Orthoses: Knee Immobilizer - Right Knee Immobilizer - Right: On when out of bed or walking Restrictions Weight Bearing Restrictions: Yes RLE Weight Bearing: Weight bearing as tolerated   Pertinent Vitals/Pain No c/o    ADL  Grooming: Performed;Wash/dry hands;Supervision/safety Where Assessed - Grooming: Unsupported standing Toilet Transfer: Research scientist (life sciences) Method: Sit to Barista: Comfort height toilet;Grab bars Toileting - Architect and Hygiene: Performed;Supervision/safety Where Assessed - Engineer, mining and Hygiene: Standing Tub/Shower Transfer:  Simulated;Supervision/safety (Simulated transfer in pt room using blanket/towel rolls ) Tub/Shower Transfer Method: Ambulating;Anterior-posterior Tub/Shower Transfer Equipment: Walk in shower Equipment Used: Rolling walker;Knee Immobilizer Transfers/Ambulation Related to ADLs: Pt supervision level for functional mobility in room, bathroom, hall and transfers into bathroom noted, using RW. ADL Comments: Pt and his wife were educated in and performed simulated shower transfer in room today x2 at supervision level using RW and anterior/posterior approach w/ vc's. Pt states that he has tub bench at home w/ walk in shower and small step. Pt & Pt's wife were educated in LB ADL's for bathing and dressing techniques, they state that pt has all necessary a/e and DME which was reviewed today. Pt is also supervision level for grooming standing at sink and toilet transfers. He plans to d/c home w/ 24/supervision & assist PRN. He plans to have therapy set up through home health.    OT Diagnosis:    OT Problem List:   OT Treatment Interventions:     OT Goals(current goals can now be found in the care plan section) Acute Rehab OT Goals Patient Stated Goal: To get back to his walking program  Visit Information  Last OT Received On: 05/20/13 Assistance Needed: +1 History of Present Illness: Pt. underwent R sx secondary to infection. Right total knee infection, status post antibiotic spacer placement    Subjective Data      Prior Functioning       Cognition  Cognition Arousal/Alertness: Awake/alert Behavior During Therapy: WFL for tasks assessed/performed Overall Cognitive Status: Within Functional Limits for tasks assessed    Mobility  Bed Mobility Bed Mobility: Not assessed (Up in chair) Transfers Transfers: Sit to Stand;Stand to Sit Sit to Stand: 5: Supervision;With armrests;From chair/3-in-1;From toilet Stand to Sit: 5: Supervision;To chair/3-in-1;To toilet;With armrests  End of  Session OT - End of Session Equipment Utilized During Treatment: Rolling walker;Right knee immobilizer Activity Tolerance: Patient tolerated treatment well Patient left: in chair;with call bell/phone within reach;with family/visitor present  GO     Roselie Awkward Dixon 05/20/2013, 12:44 PM

## 2013-05-20 NOTE — Progress Notes (Signed)
ANTICOAGULATION CONSULT NOTE - Follow Up Consult  Pharmacy Consult for Warfarin Indication: VTE prophylaxis s/p R-TKR on 12/2  No Known Allergies  Patient Measurements: Height: 5\' 9"  (175.3 cm) Weight: 277 lb 9 oz (125.9 kg) IBW/kg (Calculated) : 70.7  Vital Signs: Temp: 97.4 F (36.3 C) (12/04 0511) Temp src: Oral (12/04 0511) BP: 130/66 mmHg (12/04 0511) Pulse Rate: 68 (12/04 0511)  Labs:  Recent Labs  05/18/13 0925 05/19/13 0445 05/20/13 0507  HGB  --  8.9* 8.6*  HCT  --  26.6* 25.1*  PLT  --  228 206  APTT 29  --   --   LABPROT 13.4 14.6 16.0*  INR 1.04 1.16 1.31  CREATININE  --  0.69  --     Estimated Creatinine Clearance: 119.2 ml/min (by C-G formula based on Cr of 0.69).   Assessment: 67 y.o. M who continues on warfarin for VTE prophylaxis s/p R-TKR on 12/2. The patient was previously on warfarin since his surgery in September at which time he underwent a R-TKA with antibiotic spacers placed. PTA the patient was therapeutic on a dose of 12 mg daily.  INR today remains SUBtherapeutic though trending up (INR 1.31 << 1.16, goal of 2-3). Hgb/Hct/Plt stable - no overt s/sx of bleeding noted. The patient and his wife were re-educated on warfarin this admission.   Goal of Therapy:  INR 2-3   Plan:  1. Repeat warfarin 12 mg x 1 dose at 1800 today 2. Will continue to monitor for any signs/symptoms of bleeding and will follow up with PT/INR in the a.m.   Georgina Pillion, PharmD, BCPS Clinical Pharmacist Pager: [phone] 05/20/2013 12:26 PM

## 2013-05-20 NOTE — Care Management Note (Signed)
Patient states that he wants to return home using the same agency he was using. CM contacted Eber Jones at [facility] and faxed orders to her. Also contacted Corrie Dandy with [facility] to make her aware of the change.Vance Peper, RN BSN

## 2013-05-20 NOTE — Progress Notes (Signed)
Subjective: Pt stable feels clear   Objective: Vital signs in last 24 hours: Temp:  [97.4 F (36.3 C)-98.6 F (37 C)] 97.4 F (36.3 C) (12/04 0511) Pulse Rate:  [68-86] 68 (12/04 0511) Resp:  [16-18] 18 (12/04 0511) BP: (110-143)/(57-66) 130/66 mmHg (12/04 0511) SpO2:  [98 %-99 %] 98 % (12/04 0511)  Intake/Output from previous day: 12/03 0701 - 12/04 0700 In: 1050 [P.O.:760; I.V.:40; IV Piggyback:250] Out: 3200 [Urine:3200] Intake/Output this shift:    Exam:  Neurovascular intact Sensation intact distally Intact pulses distally  Labs:  Recent Labs  05/19/13 0445 05/20/13 0507  HGB 8.9* 8.6*    Recent Labs  05/19/13 0445 05/20/13 0507  WBC 9.9 8.9  RBC 2.99* 2.88*  HCT 26.6* 25.1*  PLT 228 206    Recent Labs  05/19/13 0445  NA 137  K 4.6  CL 104  CO2 24  BUN 15  CREATININE 0.69  GLUCOSE 123*  CALCIUM 9.6    Recent Labs  05/19/13 0445 05/20/13 0507  INR 1.16 1.31    Assessment/Plan: Plan CPM and PT today - pt desires [facility] care - possible dc this weekend   Aminah Zabawa SCOTT 05/20/2013, 7:53 AM

## 2013-05-21 ENCOUNTER — Encounter (HOSPITAL_COMMUNITY): Payer: Self-pay | Admitting: Orthopedic Surgery

## 2013-05-21 LAB — CBC
HCT: 26.1 % — ABNORMAL LOW (ref 39.0–52.0)
Hemoglobin: 8.8 g/dL — ABNORMAL LOW (ref 13.0–17.0)
MCH: 29.5 pg (ref 26.0–34.0)
Platelets: 234 10*3/uL (ref 150–400)
RBC: 2.98 MIL/uL — ABNORMAL LOW (ref 4.22–5.81)

## 2013-05-21 LAB — PROTIME-INR
INR: 1.49 (ref 0.00–1.49)
Prothrombin Time: 17.6 seconds — ABNORMAL HIGH (ref 11.6–15.2)

## 2013-05-21 LAB — BODY FLUID CULTURE
Culture: NO GROWTH
Gram Stain: NONE SEEN
Gram Stain: NONE SEEN

## 2013-05-21 LAB — TYPE AND SCREEN
ABO/RH(D): A NEG
Antibody Screen: POSITIVE
Unit division: 0
Unit division: 0

## 2013-05-21 LAB — TISSUE CULTURE: Culture: NO GROWTH

## 2013-05-21 MED ORDER — WARFARIN SODIUM 5 MG PO TABS: 5.0000 mg | ORAL_TABLET | Freq: Every day | ORAL | Status: AC

## 2013-05-21 MED ORDER — HYDROCODONE-ACETAMINOPHEN 5-325 MG PO TABS: 1.0000 | ORAL_TABLET | ORAL | Status: AC | PRN

## 2013-05-21 MED ORDER — METHOCARBAMOL 500 MG PO TABS: 500.0000 mg | ORAL_TABLET | Freq: Four times a day (QID) | ORAL | Status: AC | PRN

## 2013-05-21 NOTE — Progress Notes (Signed)
Subjective: Pt stable - pain controlled - incision ok   Objective: Vital signs in last 24 hours: Temp:  [98.8 F (37.1 C)-98.9 F (37.2 C)] 98.9 F (37.2 C) (12/05 0617) Pulse Rate:  [67-88] 67 (12/05 0617) Resp:  [17-18] 18 (12/05 0617) BP: (123-133)/(60-64) 133/60 mmHg (12/05 0617) SpO2:  [95 %-100 %] 95 % (12/05 0617)  Intake/Output from previous day: 12/04 0701 - 12/05 0700 In: 925 [P.O.:925] Out: 2551 [Urine:2551] Intake/Output this shift:    Exam:  Neurovascular intact Sensation intact distally Intact pulses distally  Labs:  Recent Labs  05/19/13 0445 05/20/13 0507 05/21/13 0530  HGB 8.9* 8.6* 8.8*    Recent Labs  05/20/13 0507 05/21/13 0530  WBC 8.9 8.6  RBC 2.88* 2.98*  HCT 25.1* 26.1*  PLT 206 234    Recent Labs  05/19/13 0445  NA 137  K 4.6  CL 104  CO2 24  BUN 15  CREATININE 0.69  GLUCOSE 123*  CALCIUM 9.6    Recent Labs  05/20/13 0507 05/21/13 0530  INR 1.31 1.49    Assessment/Plan: Pt doing well - plan dc today   DEAN,GREGORY SCOTT 05/21/2013, 8:14 AM

## 2013-05-21 NOTE — Progress Notes (Signed)
Patient discharged to home accompanied by wife. Discharge instructions and rx given and explained and patient stated understanding. Patients IV was removed and he left unit in a stable condition via wheelchair.

## 2013-05-21 NOTE — Progress Notes (Signed)
Physical Therapy Treatment Patient Details Name: Casey Donaldson. MRN: [id number] DOB: [birth year] Today's Date: 05/21/2013 Time: 4098-1191 PT Time Calculation (min): 38 min  PT Assessment / Plan / Recommendation  History of Present Illness Pt. underwent R sx secondary to infection. Right total knee infection, status post antibiotic spacer placement   PT Comments   POD # 3 R TKRevision following removal antibiotic spacer.  Pt OOB in recliner.  Assisted with amb then performed all TKR TE's following handout.  Applied ICE.  Instructed on KI use till active SLR and instructed on proper positioning R LE in extension using towel roll to prevent hip external rotation/knee flex.  Pt asked about aside sleeping, so instructed hime to wear KI at night to ensure R LE extension during night.    Follow Up Recommendations   Home Health PT     Does the patient have the potential to tolerate intense rehabilitation     Barriers to Discharge        Equipment Recommendations       Recommendations for Other Services    Frequency     Progress towards PT Goals Progress towards PT goals: Progressing toward goals  Plan      Precautions / Restrictions Precautions Precautions: Knee;Fall Precaution Comments: Reviewed towel roll placement and no pillow under knee Required Braces or Orthoses: Knee Immobilizer - Right Knee Immobilizer - Right: On when out of bed or walking Restrictions Weight Bearing Restrictions: No RLE Weight Bearing: Weight bearing as tolerated     Pertinent Vitals/Pain C/o 4/10 ICE applied    Mobility  Bed Mobility Bed Mobility: Not assessed Details for Bed Mobility Assistance: Pt OOB in recliner Transfers Transfers: Sit to Stand;Stand to Sit Sit to Stand: 5: Supervision;4: Min guard;From chair/3-in-1 Stand to Sit: 5: Supervision;4: Min guard;To chair/3-in-1 Details for Transfer Assistance: <25% VC's on safety with turns and increased  time Ambulation/Gait Ambulation/Gait Assistance: 4: Min guard;5: Supervision Ambulation Distance (Feet): 145 Feet Assistive device: Rolling walker Ambulation/Gait Assistance Details: increased time and <25% VC's on proper walker to self distance and increased time Gait Pattern: Step-to pattern;Step-through pattern;Decreased stride length Gait velocity: Decreased    Exercises   Total Knee Replacement TE's 10 reps B LE ankle pumps 10 reps knee presses 10 reps heel slides  10 reps SAQ's 10 reps SLR's 10 reps ABD 10 reps knee bends sitting 10 reps LAQ's in sitting Followed by ICE     PT Goals (current goals can now be found in the care plan section)    Visit Information  Last PT Received On: 05/21/13 Assistance Needed: +1 History of Present Illness: Pt. underwent R sx secondary to infection. Right total knee infection, status post antibiotic spacer placement    Subjective Data      Cognition       Balance     End of Session PT - End of Session Equipment Utilized During Treatment: Gait belt;Right knee immobilizer Activity Tolerance: Patient tolerated treatment well Patient left: in chair;with call bell/phone within reach;with family/visitor present Nurse Communication:  (ready for D/C) CPM Right Knee CPM Right Knee: Off   Felecia Shelling  PTA [facility]  Acute  Rehab Pager      [phone]

## 2013-05-23 LAB — ANAEROBIC CULTURE

## 2013-06-22 NOTE — Discharge Summary (Signed)
Physician Discharge Summary  Patient ID: Casey Donaldson. MRN: [id number] DOB/AGE: 06-22-1945 57 y.o.  Admit date: 05/18/2013 Discharge date: 05/21/2013 Admission Diagnoses:  Active Problems:   Infection of prosthetic knee joint   Discharge Diagnoses:  Same  Surgeries: Procedure(s): TOTAL KNEE REVISION on 05/18/2013   Consultants:    Discharged Condition: Stable  Hospital Course: Casey Donaldson. is an 68 y.o. male who was admitted 05/18/2013 with a chief complaint of TKA infection s/p removal of components 8 weeks prior, and found to have a diagnosis of tka infection s/p removal of components.  They were brought to the operating room on 05/18/2013 and underwent the above named procedures. Post op cxs negative - The patients mental status remained clear following this procedure Tolerated PT and mobilized indepedently before dc to home with [place]  Antibiotics given:  Anti-infectives   Start     Dose/Rate Route Frequency Ordered Stop   05/18/13 2200  ceFAZolin (ANCEF) IVPB 2 g/50 mL premix     2 g 100 mL/hr over 30 Minutes Intravenous 3 times per day 05/18/13 2004 05/20/13 1449   05/18/13 1545  ceFAZolin (ANCEF) IVPB 2 g/50 mL premix  Status:  Discontinued     2 g 100 mL/hr over 30 Minutes Intravenous  Once 05/18/13 1532 05/18/13 1904   05/18/13 1310  vancomycin (VANCOCIN) powder  Status:  Discontinued       As needed 05/18/13 1310 05/18/13 1601   05/18/13 1309  gentamicin (GARAMYCIN) injection  Status:  Discontinued       As needed 05/18/13 1310 05/18/13 1601   05/18/13 0909  ceFAZolin (ANCEF) IVPB 2 g/50 mL premix     2 g 100 mL/hr over 30 Minutes Intravenous On call to O.R. 05/18/13 0909 05/18/13 1539   05/18/13 0600  ceFAZolin (ANCEF) IVPB 2 g/50 mL premix  Status:  Discontinued     2 g 100 mL/hr over 30 Minutes Intravenous On call to O.R. 05/17/13 1607 05/18/13 1904    .  Recent vital signs:  Filed Vitals:   05/21/13 0917  BP: 117/58  Pulse:   Temp:    Resp:     Recent laboratory studies:  Results for orders placed during the hospital encounter of 05/18/13  URINE CULTURE      Result Value Range   Specimen Description URINE, CLEAN CATCH     Special Requests NONE     Culture  Setup Time       Value: 05/19/2013 16:33     Performed at [facility]       Value: NO GROWTH     Performed at [facility]   Culture       Value: NO GROWTH     Performed at [facility]   Report Status 05/20/2013 FINAL    TISSUE CULTURE      Result Value Range   Specimen Description TISSUE     Special Requests SYNOVIUM RIGHT KNEE     Gram Stain       Value: RARE WBC PRESENT, PREDOMINANTLY MONONUCLEAR     NO SQUAMOUS EPITHELIAL CELLS SEEN     NO ORGANISMS SEEN     Performed at [facility]   Culture       Value: NO GROWTH 3 DAYS     Performed at [facility]   Report Status 05/21/2013 FINAL    ANAEROBIC CULTURE      Result Value Range  Specimen Description TISSUE     Special Requests SYNOVIUM RIGHT KNEE     Gram Stain       Value: RARE WBC PRESENT, PREDOMINANTLY MONONUCLEAR     NO SQUAMOUS EPITHELIAL CELLS SEEN     NO ORGANISMS SEEN     Performed at [facility]   Culture       Value: NO ANAEROBES ISOLATED     Performed at [facility]   Report Status 05/23/2013 FINAL    BODY FLUID CULTURE      Result Value Range   Specimen Description SYNOVIAL FLUID RIGHT KNEE     Special Requests FLUID ON SWAB SPEC NO 2     Gram Stain       Value: NO WBC SEEN     NO ORGANISMS SEEN     Performed at [facility]   Culture       Value: NO GROWTH 3 DAYS     Performed at [facility]   Report Status 05/21/2013 FINAL    ANAEROBIC CULTURE      Result Value Range   Specimen Description SYNOVIAL FLUID RIGHT KNEE     Special Requests FLUID ON SWAB SPEC NO 2     Gram Stain       Value: NO WBC SEEN     NO ORGANISMS SEEN     Performed at [facility]   Culture        Value: NO ANAEROBES ISOLATED     Performed at [facility]   Report Status 05/23/2013 FINAL    BODY FLUID CULTURE      Result Value Range   Specimen Description SYNOVIAL FLUID RIGHT KNEE     Special Requests FLUID ON SWAB SPEC NO 1     Gram Stain       Value: NO WBC SEEN     NO ORGANISMS SEEN     Performed at [facility]   Culture       Value: NO GROWTH 3 DAYS     Performed at [facility]   Report Status 05/21/2013 FINAL    ANAEROBIC CULTURE      Result Value Range   Specimen Description SYNOVIAL FLUID RIGHT KNEE     Special Requests FLUID ON SWAB SPEC NO 1     Gram Stain       Value: NO WBC SEEN     NO ORGANISMS SEEN     Performed at [facility]   Culture       Value: NO ANAEROBES ISOLATED     Performed at [facility]   Report Status 05/23/2013 FINAL    URINALYSIS, ROUTINE W REFLEX MICROSCOPIC      Result Value Range   Color, Urine YELLOW  YELLOW   APPearance CLEAR  CLEAR   Specific Gravity, Urine 1.016  1.005 - 1.030   pH 6.5  5.0 - 8.0   Glucose, UA NEGATIVE  NEGATIVE mg/dL   Hgb urine dipstick NEGATIVE  NEGATIVE   Bilirubin Urine NEGATIVE  NEGATIVE   Ketones, ur NEGATIVE  NEGATIVE mg/dL   Protein, ur NEGATIVE  NEGATIVE mg/dL   Urobilinogen, UA 0.2  0.0 - 1.0 mg/dL   Nitrite NEGATIVE  NEGATIVE   Leukocytes, UA NEGATIVE  NEGATIVE  APTT      Result Value Range   aPTT 29  24 - 37 seconds  PROTIME-INR      Result Value Range  Prothrombin Time 13.4  11.6 - 15.2 seconds   INR 1.04  0.00 - 1.49  PROTIME-INR      Result Value Range   Prothrombin Time 14.6  11.6 - 15.2 seconds   INR 1.16  0.00 - 1.49  CBC      Result Value Range   WBC 9.9  4.0 - 10.5 K/uL   RBC 2.99 (*) 4.22 - 5.81 MIL/uL   Hemoglobin 8.9 (*) 13.0 - 17.0 g/dL   HCT 26.6 (*) 39.0 - 52.0 %   MCV 89.0  78.0 - 100.0 fL   MCH 29.8  26.0 - 34.0 pg   MCHC 33.5  30.0 - 36.0 g/dL   RDW 14.4  11.5 - 15.5 %   Platelets 228  150 - 400 K/uL  BASIC METABOLIC  PANEL      Result Value Range   Sodium 137  135 - 145 mEq/L   Potassium 4.6  3.5 - 5.1 mEq/L   Chloride 104  96 - 112 mEq/L   CO2 24  19 - 32 mEq/L   Glucose, Bld 123 (*) 70 - 99 mg/dL   BUN 15  6 - 23 mg/dL   Creatinine, Ser 0.69  0.50 - 1.35 mg/dL   Calcium 9.6  8.4 - 10.5 mg/dL   GFR calc non Af Amer >90  >90 mL/min   GFR calc Af Amer >90  >90 mL/min  PROTIME-INR      Result Value Range   Prothrombin Time 16.0 (*) 11.6 - 15.2 seconds   INR 1.31  0.00 - 1.49  CBC      Result Value Range   WBC 8.9  4.0 - 10.5 K/uL   RBC 2.88 (*) 4.22 - 5.81 MIL/uL   Hemoglobin 8.6 (*) 13.0 - 17.0 g/dL   HCT 25.1 (*) 39.0 - 52.0 %   MCV 87.2  78.0 - 100.0 fL   MCH 29.9  26.0 - 34.0 pg   MCHC 34.3  30.0 - 36.0 g/dL   RDW 14.3  11.5 - 15.5 %   Platelets 206  150 - 400 K/uL  PROTIME-INR      Result Value Range   Prothrombin Time 17.6 (*) 11.6 - 15.2 seconds   INR 1.49  0.00 - 1.49  CBC      Result Value Range   WBC 8.6  4.0 - 10.5 K/uL   RBC 2.98 (*) 4.22 - 5.81 MIL/uL   Hemoglobin 8.8 (*) 13.0 - 17.0 g/dL   HCT 26.1 (*) 39.0 - 52.0 %   MCV 87.6  78.0 - 100.0 fL   MCH 29.5  26.0 - 34.0 pg   MCHC 33.7  30.0 - 36.0 g/dL   RDW 14.5  11.5 - 15.5 %   Platelets 234  150 - 400 K/uL  TYPE AND SCREEN      Result Value Range   ABO/RH(D) A NEG     Antibody Screen POS     Sample Expiration 05/21/2013      Discharge Medications:     Medication List         CULTURELLE PO  Take 1 capsule by mouth daily.     enalapril 20 MG tablet  Commonly known as:  VASOTEC  Take 20 mg by mouth 2 (two) times daily.     felodipine 5 MG 24 hr tablet  Commonly known as:  PLENDIL  Take 5 mg by mouth every morning.     fluticasone 50 MCG/ACT nasal spray  Commonly known as:  FLONASE  Place 2 sprays into both nostrils at bedtime.     hydrochlorothiazide 12.5 MG capsule  Commonly known as:  MICROZIDE  Take 12.5 mg by mouth daily.     HYDROcodone-acetaminophen 5-325 MG per tablet  Commonly known as:   NORCO/VICODIN  Take 1-2 tablets by mouth every 4 (four) hours as needed for moderate pain.     methocarbamol 500 MG tablet  Commonly known as:  ROBAXIN  Take 1 tablet (500 mg total) by mouth every 6 (six) hours as needed for muscle spasms.     rosuvastatin 20 MG tablet  Commonly known as:  CRESTOR  Take 20 mg by mouth at bedtime.     warfarin 5 MG tablet  Commonly known as:  COUMADIN  Take 1 tablet (5 mg total) by mouth daily.        Diagnostic Studies: No results found.  Disposition: 01-Home or Self Care      Discharge Orders   Future Orders Complete By Expires   Call MD / Call 911  As directed    Comments:     If you experience chest pain or shortness of breath, CALL 911 and be transported to the hospital emergency room.  If you develope a fever above 101 F, pus (white drainage) or increased drainage or redness at the wound, or calf pain, call your surgeon's office.   Constipation Prevention  As directed    Comments:     Drink plenty of fluids.  Prune juice may be helpful.  You may use a stool softener, such as Colace (over the counter) 100 mg twice a day.  Use MiraLax (over the counter) for constipation as needed.   Diet - low sodium heart healthy  As directed    Discharge instructions  As directed    Comments:     1. Weight bearing as tolerated with knee immobilizer in place. CPM 6 hours per day Keep incision dry   Increase activity slowly as tolerated  As directed          Signed: Candia Kingsbury SCOTT 06/22/2013, 12:49 PM

## 2015-07-11 DIAGNOSIS — G4733 Obstructive sleep apnea (adult) (pediatric): Secondary | ICD-10-CM | POA: Diagnosis not present

## 2015-07-14 DIAGNOSIS — R972 Elevated prostate specific antigen [PSA]: Secondary | ICD-10-CM | POA: Diagnosis not present

## 2015-07-20 DIAGNOSIS — D510 Vitamin B12 deficiency anemia due to intrinsic factor deficiency: Secondary | ICD-10-CM | POA: Diagnosis not present

## 2015-07-24 DIAGNOSIS — R972 Elevated prostate specific antigen [PSA]: Secondary | ICD-10-CM | POA: Diagnosis not present

## 2015-08-28 DIAGNOSIS — D510 Vitamin B12 deficiency anemia due to intrinsic factor deficiency: Secondary | ICD-10-CM | POA: Diagnosis not present

## 2015-09-13 DIAGNOSIS — M109 Gout, unspecified: Secondary | ICD-10-CM | POA: Diagnosis not present

## 2015-09-13 DIAGNOSIS — R5383 Other fatigue: Secondary | ICD-10-CM | POA: Diagnosis not present

## 2015-09-13 DIAGNOSIS — E782 Mixed hyperlipidemia: Secondary | ICD-10-CM | POA: Diagnosis not present

## 2015-09-13 DIAGNOSIS — I10 Essential (primary) hypertension: Secondary | ICD-10-CM | POA: Diagnosis not present

## 2015-09-21 DIAGNOSIS — M109 Gout, unspecified: Secondary | ICD-10-CM | POA: Diagnosis not present

## 2015-09-21 DIAGNOSIS — J302 Other seasonal allergic rhinitis: Secondary | ICD-10-CM | POA: Diagnosis not present

## 2015-09-21 DIAGNOSIS — E782 Mixed hyperlipidemia: Secondary | ICD-10-CM | POA: Diagnosis not present

## 2015-09-21 DIAGNOSIS — I10 Essential (primary) hypertension: Secondary | ICD-10-CM | POA: Diagnosis not present

## 2015-11-20 IMAGING — CR DG KNEE 1-2V PORT*R*
4 series · 4 of 4 positions shown · non-contrast
Comparison: None.

CLINICAL DATA: Status post knee arthroplasty revision.

EXAM:
PORTABLE RIGHT KNEE - 1-2 VIEW

[AP (1 of 2)]
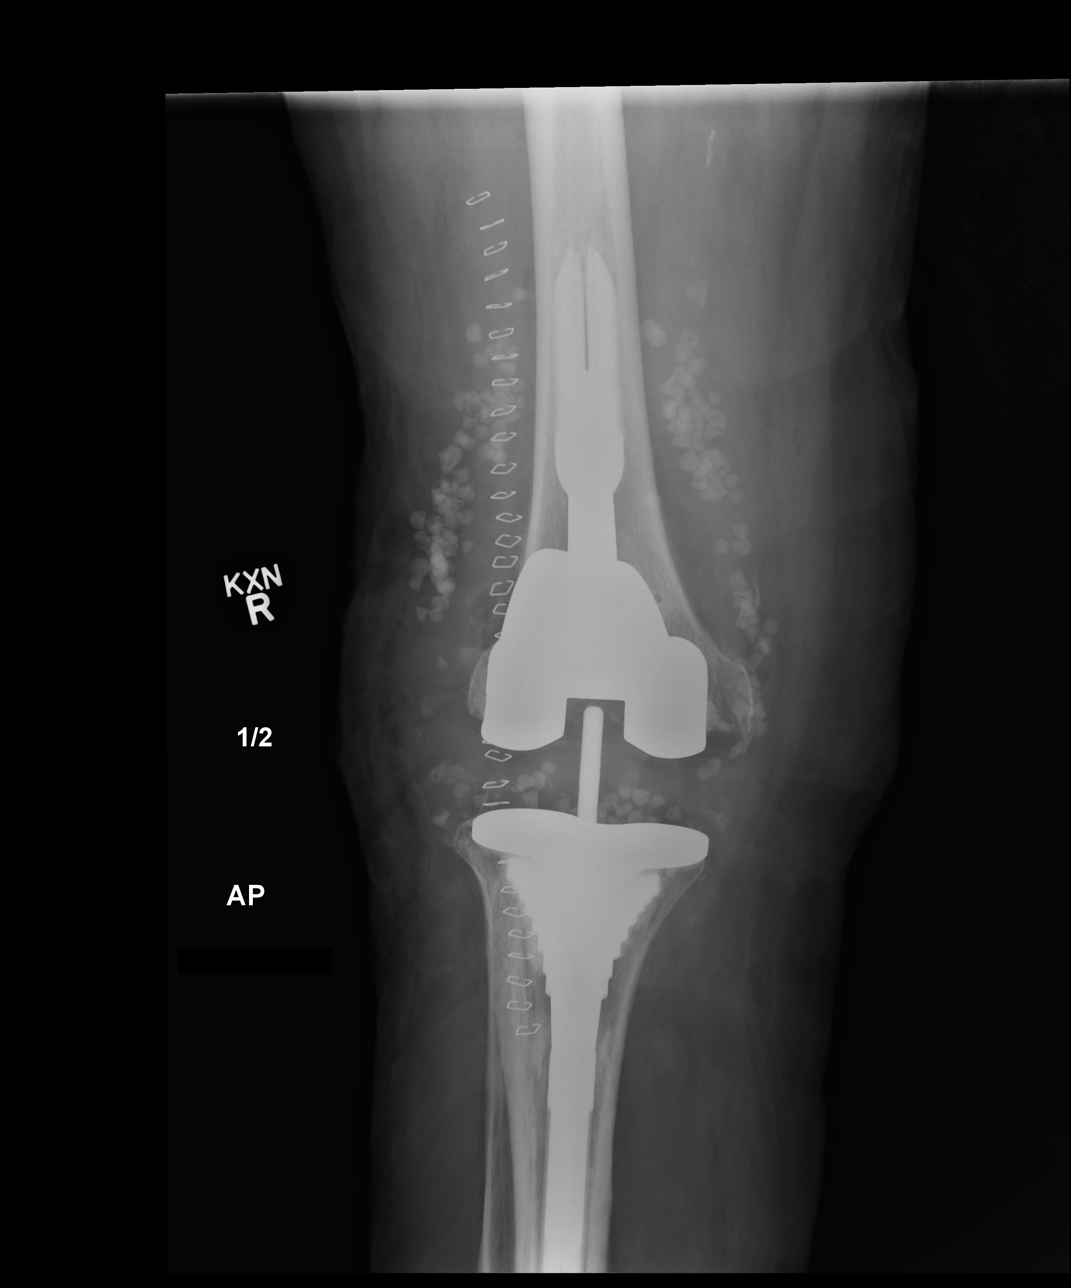

[xtable lateral]
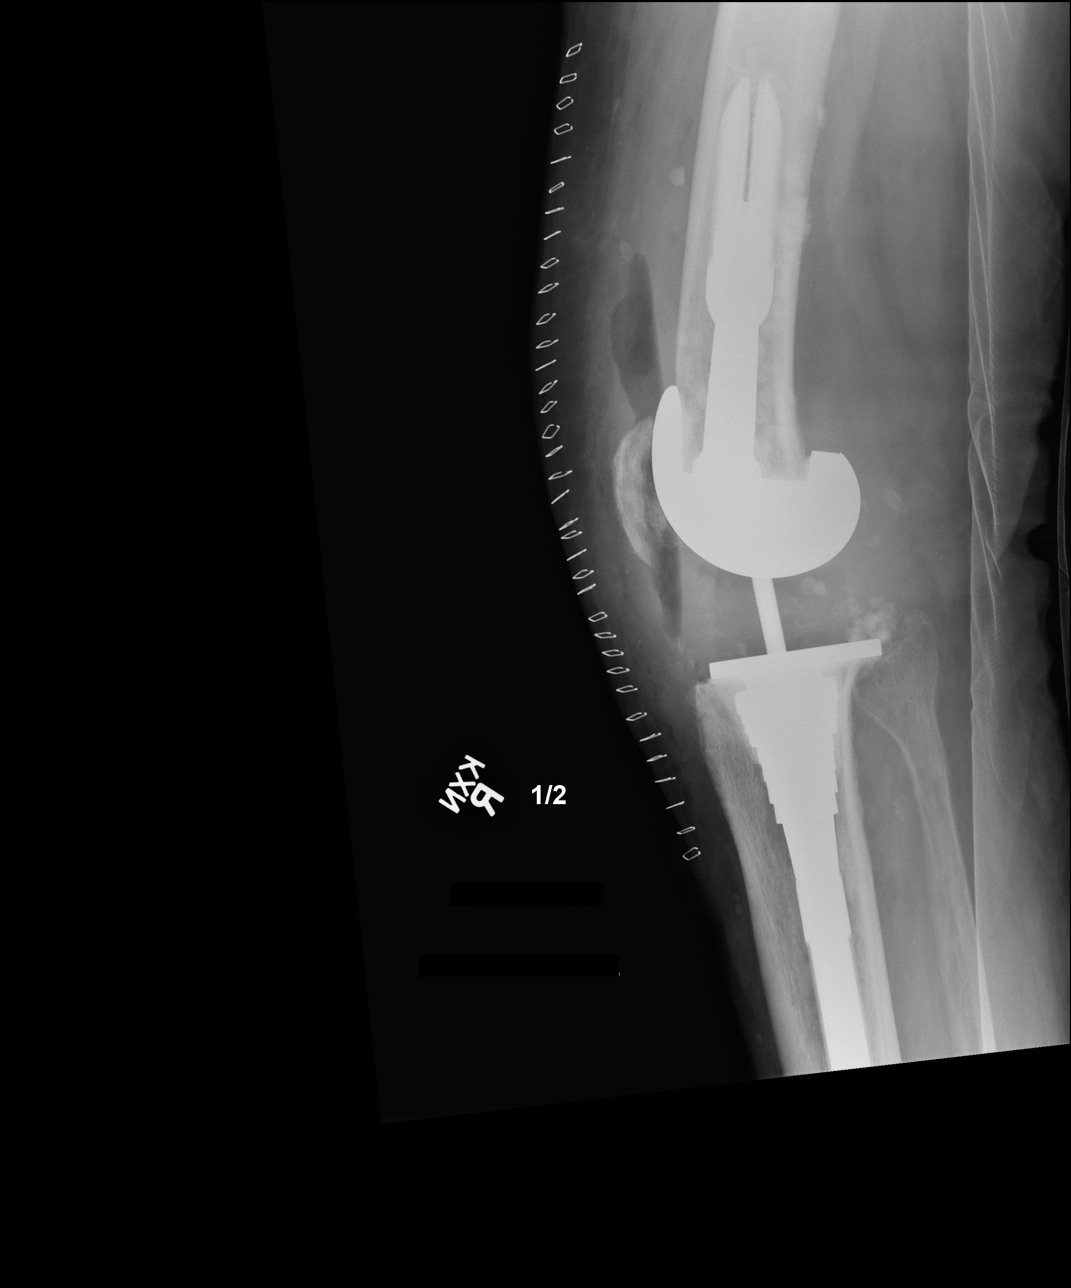

[AP (2 of 2)]
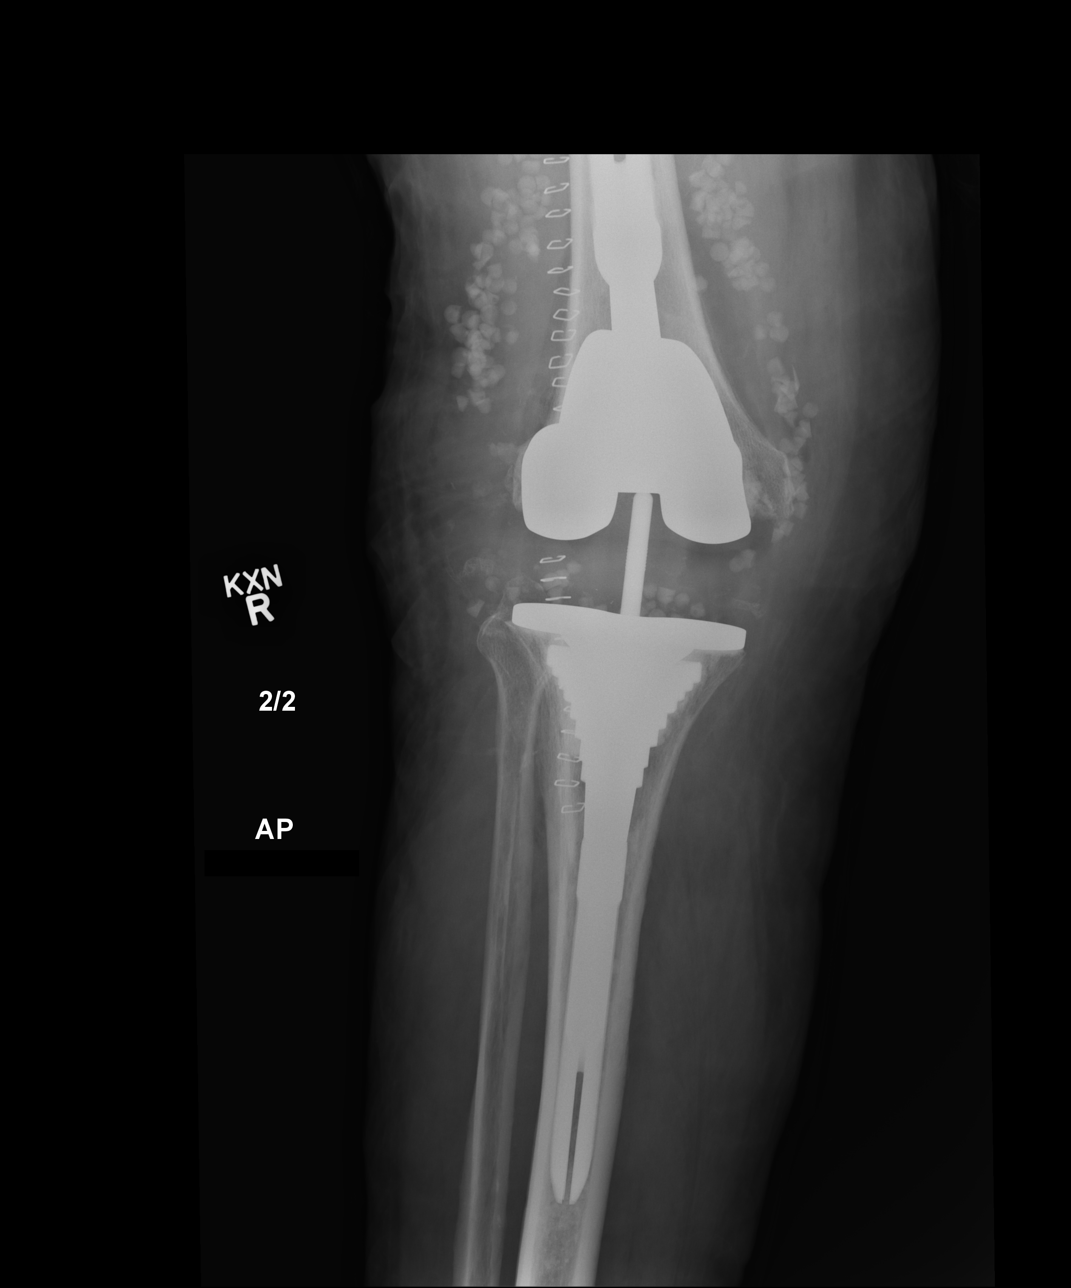

[lateral]
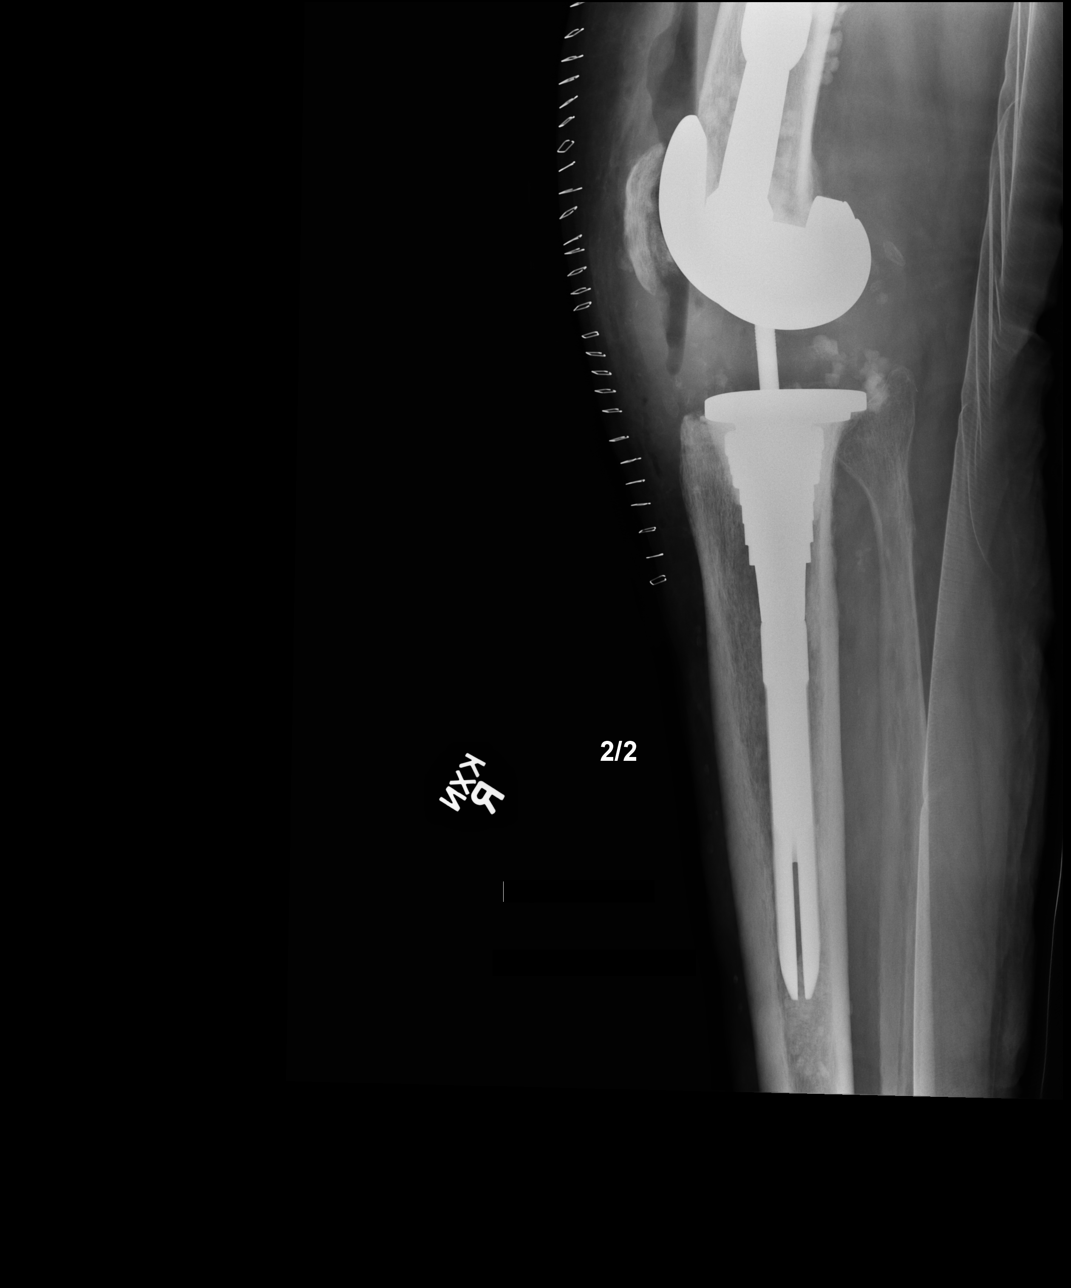

[4 of 4 positions shown; findings below may reference images not displayed]

FINDINGS: Patient has undergone knee arthroplasty. Alignment is near anatomic.
Joint space gas and fluid are identified following surgery. Surgical
clips are noted anteriorly.
IMPRESSION: Alignment near anatomic following knee arthroplasty revision.

## 2015-12-08 DIAGNOSIS — M109 Gout, unspecified: Secondary | ICD-10-CM | POA: Diagnosis not present

## 2016-02-06 DIAGNOSIS — H1132 Conjunctival hemorrhage, left eye: Secondary | ICD-10-CM | POA: Diagnosis not present

## 2016-02-08 DIAGNOSIS — R972 Elevated prostate specific antigen [PSA]: Secondary | ICD-10-CM | POA: Diagnosis not present

## 2016-02-20 DIAGNOSIS — R972 Elevated prostate specific antigen [PSA]: Secondary | ICD-10-CM | POA: Diagnosis not present

## 2016-03-07 DIAGNOSIS — Z0000 Encounter for general adult medical examination without abnormal findings: Secondary | ICD-10-CM | POA: Diagnosis not present

## 2016-03-19 DIAGNOSIS — Z1389 Encounter for screening for other disorder: Secondary | ICD-10-CM | POA: Diagnosis not present

## 2016-03-19 DIAGNOSIS — Z23 Encounter for immunization: Secondary | ICD-10-CM | POA: Diagnosis not present

## 2016-03-19 DIAGNOSIS — Z9181 History of falling: Secondary | ICD-10-CM | POA: Diagnosis not present

## 2016-03-19 DIAGNOSIS — Z79899 Other long term (current) drug therapy: Secondary | ICD-10-CM | POA: Diagnosis not present

## 2016-03-19 DIAGNOSIS — D519 Vitamin B12 deficiency anemia, unspecified: Secondary | ICD-10-CM | POA: Diagnosis not present

## 2016-03-19 DIAGNOSIS — E782 Mixed hyperlipidemia: Secondary | ICD-10-CM | POA: Diagnosis not present

## 2016-03-19 DIAGNOSIS — Z0000 Encounter for general adult medical examination without abnormal findings: Secondary | ICD-10-CM | POA: Diagnosis not present

## 2016-04-05 DIAGNOSIS — Z862 Personal history of diseases of the blood and blood-forming organs and certain disorders involving the immune mechanism: Secondary | ICD-10-CM | POA: Diagnosis not present

## 2016-04-05 DIAGNOSIS — D649 Anemia, unspecified: Secondary | ICD-10-CM | POA: Diagnosis not present

## 2016-08-15 DIAGNOSIS — L820 Inflamed seborrheic keratosis: Secondary | ICD-10-CM | POA: Diagnosis not present

## 2016-08-15 DIAGNOSIS — D1801 Hemangioma of skin and subcutaneous tissue: Secondary | ICD-10-CM | POA: Diagnosis not present

## 2016-08-15 DIAGNOSIS — R233 Spontaneous ecchymoses: Secondary | ICD-10-CM | POA: Diagnosis not present

## 2016-08-27 DIAGNOSIS — R972 Elevated prostate specific antigen [PSA]: Secondary | ICD-10-CM | POA: Diagnosis not present

## 2016-09-23 DIAGNOSIS — E782 Mixed hyperlipidemia: Secondary | ICD-10-CM | POA: Diagnosis not present

## 2016-09-23 DIAGNOSIS — Z79899 Other long term (current) drug therapy: Secondary | ICD-10-CM | POA: Diagnosis not present

## 2016-09-27 DIAGNOSIS — J309 Allergic rhinitis, unspecified: Secondary | ICD-10-CM | POA: Diagnosis not present

## 2016-09-27 DIAGNOSIS — Z6841 Body Mass Index (BMI) 40.0 and over, adult: Secondary | ICD-10-CM | POA: Diagnosis not present

## 2016-09-27 DIAGNOSIS — I10 Essential (primary) hypertension: Secondary | ICD-10-CM | POA: Diagnosis not present

## 2016-09-27 DIAGNOSIS — M109 Gout, unspecified: Secondary | ICD-10-CM | POA: Diagnosis not present

## 2016-09-27 DIAGNOSIS — E782 Mixed hyperlipidemia: Secondary | ICD-10-CM | POA: Diagnosis not present

## 2016-10-04 DIAGNOSIS — S0502XA Injury of conjunctiva and corneal abrasion without foreign body, left eye, initial encounter: Secondary | ICD-10-CM | POA: Diagnosis not present

## 2016-10-09 DIAGNOSIS — R972 Elevated prostate specific antigen [PSA]: Secondary | ICD-10-CM | POA: Diagnosis not present

## 2016-10-22 DIAGNOSIS — G4733 Obstructive sleep apnea (adult) (pediatric): Secondary | ICD-10-CM | POA: Diagnosis not present

## 2017-03-10 DIAGNOSIS — R972 Elevated prostate specific antigen [PSA]: Secondary | ICD-10-CM | POA: Diagnosis not present

## 2017-03-26 DIAGNOSIS — Z125 Encounter for screening for malignant neoplasm of prostate: Secondary | ICD-10-CM | POA: Diagnosis not present

## 2017-03-26 DIAGNOSIS — Z0000 Encounter for general adult medical examination without abnormal findings: Secondary | ICD-10-CM | POA: Diagnosis not present

## 2017-04-01 DIAGNOSIS — Z9181 History of falling: Secondary | ICD-10-CM | POA: Diagnosis not present

## 2017-04-01 DIAGNOSIS — Z6841 Body Mass Index (BMI) 40.0 and over, adult: Secondary | ICD-10-CM | POA: Diagnosis not present

## 2017-04-01 DIAGNOSIS — Z23 Encounter for immunization: Secondary | ICD-10-CM | POA: Diagnosis not present

## 2017-04-01 DIAGNOSIS — Z1331 Encounter for screening for depression: Secondary | ICD-10-CM | POA: Diagnosis not present

## 2017-04-01 DIAGNOSIS — Z0000 Encounter for general adult medical examination without abnormal findings: Secondary | ICD-10-CM | POA: Diagnosis not present

## 2017-04-10 DIAGNOSIS — N401 Enlarged prostate with lower urinary tract symptoms: Secondary | ICD-10-CM | POA: Diagnosis not present

## 2017-04-10 DIAGNOSIS — R972 Elevated prostate specific antigen [PSA]: Secondary | ICD-10-CM | POA: Diagnosis not present

## 2017-04-10 DIAGNOSIS — N138 Other obstructive and reflux uropathy: Secondary | ICD-10-CM | POA: Diagnosis not present

## 2017-06-11 DIAGNOSIS — G4733 Obstructive sleep apnea (adult) (pediatric): Secondary | ICD-10-CM | POA: Diagnosis not present

## 2017-09-18 DIAGNOSIS — R972 Elevated prostate specific antigen [PSA]: Secondary | ICD-10-CM | POA: Diagnosis not present

## 2017-10-10 DIAGNOSIS — M109 Gout, unspecified: Secondary | ICD-10-CM | POA: Diagnosis not present

## 2017-10-10 DIAGNOSIS — J302 Other seasonal allergic rhinitis: Secondary | ICD-10-CM | POA: Diagnosis not present

## 2017-10-10 DIAGNOSIS — Z6841 Body Mass Index (BMI) 40.0 and over, adult: Secondary | ICD-10-CM | POA: Diagnosis not present

## 2017-10-10 DIAGNOSIS — E782 Mixed hyperlipidemia: Secondary | ICD-10-CM | POA: Diagnosis not present

## 2017-10-10 DIAGNOSIS — I10 Essential (primary) hypertension: Secondary | ICD-10-CM | POA: Diagnosis not present

## 2017-10-13 DIAGNOSIS — R972 Elevated prostate specific antigen [PSA]: Secondary | ICD-10-CM | POA: Diagnosis not present

## 2018-01-30 DIAGNOSIS — H2589 Other age-related cataract: Secondary | ICD-10-CM | POA: Diagnosis not present

## 2018-01-30 DIAGNOSIS — H11153 Pinguecula, bilateral: Secondary | ICD-10-CM | POA: Diagnosis not present

## 2018-01-30 DIAGNOSIS — H18413 Arcus senilis, bilateral: Secondary | ICD-10-CM | POA: Diagnosis not present

## 2018-03-31 DIAGNOSIS — R972 Elevated prostate specific antigen [PSA]: Secondary | ICD-10-CM | POA: Diagnosis not present

## 2018-04-13 DIAGNOSIS — R972 Elevated prostate specific antigen [PSA]: Secondary | ICD-10-CM | POA: Diagnosis not present

## 2018-04-13 DIAGNOSIS — N401 Enlarged prostate with lower urinary tract symptoms: Secondary | ICD-10-CM | POA: Diagnosis not present

## 2018-04-13 DIAGNOSIS — N138 Other obstructive and reflux uropathy: Secondary | ICD-10-CM | POA: Diagnosis not present

## 2018-04-29 DIAGNOSIS — R5383 Other fatigue: Secondary | ICD-10-CM | POA: Diagnosis not present

## 2018-04-29 DIAGNOSIS — E782 Mixed hyperlipidemia: Secondary | ICD-10-CM | POA: Diagnosis not present

## 2018-04-29 DIAGNOSIS — Z1331 Encounter for screening for depression: Secondary | ICD-10-CM | POA: Diagnosis not present

## 2018-04-29 DIAGNOSIS — Z1339 Encounter for screening examination for other mental health and behavioral disorders: Secondary | ICD-10-CM | POA: Diagnosis not present

## 2018-04-29 DIAGNOSIS — Z6841 Body Mass Index (BMI) 40.0 and over, adult: Secondary | ICD-10-CM | POA: Diagnosis not present

## 2018-04-29 DIAGNOSIS — Z0000 Encounter for general adult medical examination without abnormal findings: Secondary | ICD-10-CM | POA: Diagnosis not present

## 2018-04-29 DIAGNOSIS — I10 Essential (primary) hypertension: Secondary | ICD-10-CM | POA: Diagnosis not present

## 2018-04-29 DIAGNOSIS — Z9181 History of falling: Secondary | ICD-10-CM | POA: Diagnosis not present

## 2018-04-29 DIAGNOSIS — Z23 Encounter for immunization: Secondary | ICD-10-CM | POA: Diagnosis not present

## 2018-04-29 DIAGNOSIS — M109 Gout, unspecified: Secondary | ICD-10-CM | POA: Diagnosis not present

## 2018-06-01 DIAGNOSIS — N138 Other obstructive and reflux uropathy: Secondary | ICD-10-CM | POA: Diagnosis not present

## 2018-06-01 DIAGNOSIS — R972 Elevated prostate specific antigen [PSA]: Secondary | ICD-10-CM | POA: Diagnosis not present

## 2018-06-01 DIAGNOSIS — N401 Enlarged prostate with lower urinary tract symptoms: Secondary | ICD-10-CM | POA: Diagnosis not present

## 2018-10-02 DIAGNOSIS — E782 Mixed hyperlipidemia: Secondary | ICD-10-CM | POA: Diagnosis not present

## 2018-10-02 DIAGNOSIS — R972 Elevated prostate specific antigen [PSA]: Secondary | ICD-10-CM | POA: Diagnosis not present

## 2018-10-02 DIAGNOSIS — I10 Essential (primary) hypertension: Secondary | ICD-10-CM | POA: Diagnosis not present

## 2018-10-02 DIAGNOSIS — Z6841 Body Mass Index (BMI) 40.0 and over, adult: Secondary | ICD-10-CM | POA: Diagnosis not present

## 2018-10-02 DIAGNOSIS — Z79899 Other long term (current) drug therapy: Secondary | ICD-10-CM | POA: Diagnosis not present

## 2018-10-02 DIAGNOSIS — M109 Gout, unspecified: Secondary | ICD-10-CM | POA: Diagnosis not present

## 2018-10-02 DIAGNOSIS — J302 Other seasonal allergic rhinitis: Secondary | ICD-10-CM | POA: Diagnosis not present

## 2018-10-14 DIAGNOSIS — N401 Enlarged prostate with lower urinary tract symptoms: Secondary | ICD-10-CM | POA: Diagnosis not present

## 2018-10-14 DIAGNOSIS — N138 Other obstructive and reflux uropathy: Secondary | ICD-10-CM | POA: Diagnosis not present

## 2018-10-14 DIAGNOSIS — R972 Elevated prostate specific antigen [PSA]: Secondary | ICD-10-CM | POA: Diagnosis not present

## 2019-01-06 DIAGNOSIS — R972 Elevated prostate specific antigen [PSA]: Secondary | ICD-10-CM | POA: Diagnosis not present

## 2019-01-16 DIAGNOSIS — D225 Melanocytic nevi of trunk: Secondary | ICD-10-CM | POA: Diagnosis not present

## 2019-01-16 DIAGNOSIS — D1801 Hemangioma of skin and subcutaneous tissue: Secondary | ICD-10-CM | POA: Diagnosis not present

## 2019-01-16 DIAGNOSIS — D485 Neoplasm of uncertain behavior of skin: Secondary | ICD-10-CM | POA: Diagnosis not present

## 2019-01-16 DIAGNOSIS — Z85820 Personal history of malignant melanoma of skin: Secondary | ICD-10-CM | POA: Diagnosis not present

## 2019-02-10 DIAGNOSIS — K5730 Diverticulosis of large intestine without perforation or abscess without bleeding: Secondary | ICD-10-CM | POA: Diagnosis not present

## 2019-02-10 DIAGNOSIS — K5900 Constipation, unspecified: Secondary | ICD-10-CM | POA: Diagnosis not present

## 2019-03-02 DIAGNOSIS — D126 Benign neoplasm of colon, unspecified: Secondary | ICD-10-CM | POA: Diagnosis not present

## 2019-03-02 DIAGNOSIS — G4730 Sleep apnea, unspecified: Secondary | ICD-10-CM | POA: Diagnosis not present

## 2019-03-02 DIAGNOSIS — K5730 Diverticulosis of large intestine without perforation or abscess without bleeding: Secondary | ICD-10-CM | POA: Diagnosis not present

## 2019-03-02 DIAGNOSIS — Z85828 Personal history of other malignant neoplasm of skin: Secondary | ICD-10-CM | POA: Diagnosis not present

## 2019-03-02 DIAGNOSIS — K635 Polyp of colon: Secondary | ICD-10-CM | POA: Diagnosis not present

## 2019-03-02 DIAGNOSIS — Z86010 Personal history of colonic polyps: Secondary | ICD-10-CM | POA: Diagnosis not present

## 2019-03-02 DIAGNOSIS — K648 Other hemorrhoids: Secondary | ICD-10-CM | POA: Diagnosis not present

## 2019-03-02 DIAGNOSIS — Z79899 Other long term (current) drug therapy: Secondary | ICD-10-CM | POA: Diagnosis not present

## 2019-03-02 DIAGNOSIS — D124 Benign neoplasm of descending colon: Secondary | ICD-10-CM | POA: Diagnosis not present

## 2019-03-02 DIAGNOSIS — I10 Essential (primary) hypertension: Secondary | ICD-10-CM | POA: Diagnosis not present

## 2019-03-25 DIAGNOSIS — G4733 Obstructive sleep apnea (adult) (pediatric): Secondary | ICD-10-CM | POA: Diagnosis not present

## 2019-04-24 DIAGNOSIS — G4733 Obstructive sleep apnea (adult) (pediatric): Secondary | ICD-10-CM | POA: Diagnosis not present

## 2019-05-18 DIAGNOSIS — R972 Elevated prostate specific antigen [PSA]: Secondary | ICD-10-CM | POA: Diagnosis not present

## 2019-06-01 DIAGNOSIS — N138 Other obstructive and reflux uropathy: Secondary | ICD-10-CM | POA: Diagnosis not present

## 2019-06-01 DIAGNOSIS — N401 Enlarged prostate with lower urinary tract symptoms: Secondary | ICD-10-CM | POA: Diagnosis not present

## 2019-06-01 DIAGNOSIS — R972 Elevated prostate specific antigen [PSA]: Secondary | ICD-10-CM | POA: Diagnosis not present

## 2019-06-03 DIAGNOSIS — R972 Elevated prostate specific antigen [PSA]: Secondary | ICD-10-CM | POA: Diagnosis not present

## 2019-06-03 DIAGNOSIS — Z23 Encounter for immunization: Secondary | ICD-10-CM | POA: Diagnosis not present

## 2019-06-03 DIAGNOSIS — Z6841 Body Mass Index (BMI) 40.0 and over, adult: Secondary | ICD-10-CM | POA: Diagnosis not present

## 2019-06-03 DIAGNOSIS — Z9181 History of falling: Secondary | ICD-10-CM | POA: Diagnosis not present

## 2019-06-03 DIAGNOSIS — M109 Gout, unspecified: Secondary | ICD-10-CM | POA: Diagnosis not present

## 2019-06-03 DIAGNOSIS — D649 Anemia, unspecified: Secondary | ICD-10-CM | POA: Diagnosis not present

## 2019-06-03 DIAGNOSIS — I10 Essential (primary) hypertension: Secondary | ICD-10-CM | POA: Diagnosis not present

## 2019-06-03 DIAGNOSIS — R5383 Other fatigue: Secondary | ICD-10-CM | POA: Diagnosis not present

## 2019-06-03 DIAGNOSIS — E782 Mixed hyperlipidemia: Secondary | ICD-10-CM | POA: Diagnosis not present

## 2019-06-03 DIAGNOSIS — Z1331 Encounter for screening for depression: Secondary | ICD-10-CM | POA: Diagnosis not present

## 2019-06-03 DIAGNOSIS — Z0000 Encounter for general adult medical examination without abnormal findings: Secondary | ICD-10-CM | POA: Diagnosis not present

## 2019-06-22 DIAGNOSIS — Z87891 Personal history of nicotine dependence: Secondary | ICD-10-CM | POA: Diagnosis not present

## 2019-06-22 DIAGNOSIS — I714 Abdominal aortic aneurysm, without rupture: Secondary | ICD-10-CM | POA: Diagnosis not present

## 2019-08-24 DIAGNOSIS — H6123 Impacted cerumen, bilateral: Secondary | ICD-10-CM | POA: Diagnosis not present

## 2019-08-24 DIAGNOSIS — H61303 Acquired stenosis of external ear canal, unspecified, bilateral: Secondary | ICD-10-CM | POA: Diagnosis not present

## 2019-08-24 DIAGNOSIS — G4733 Obstructive sleep apnea (adult) (pediatric): Secondary | ICD-10-CM | POA: Diagnosis not present

## 2019-08-24 DIAGNOSIS — H9193 Unspecified hearing loss, bilateral: Secondary | ICD-10-CM | POA: Diagnosis not present

## 2019-08-24 DIAGNOSIS — Z9989 Dependence on other enabling machines and devices: Secondary | ICD-10-CM | POA: Diagnosis not present

## 2019-10-05 DIAGNOSIS — H6093 Unspecified otitis externa, bilateral: Secondary | ICD-10-CM | POA: Diagnosis not present

## 2019-10-05 DIAGNOSIS — H6123 Impacted cerumen, bilateral: Secondary | ICD-10-CM | POA: Diagnosis not present

## 2019-10-05 DIAGNOSIS — H61303 Acquired stenosis of external ear canal, unspecified, bilateral: Secondary | ICD-10-CM | POA: Diagnosis not present

## 2019-10-12 DIAGNOSIS — H61303 Acquired stenosis of external ear canal, unspecified, bilateral: Secondary | ICD-10-CM | POA: Diagnosis not present

## 2019-10-12 DIAGNOSIS — H6093 Unspecified otitis externa, bilateral: Secondary | ICD-10-CM | POA: Diagnosis not present

## 2019-10-19 DIAGNOSIS — H61303 Acquired stenosis of external ear canal, unspecified, bilateral: Secondary | ICD-10-CM | POA: Diagnosis not present

## 2019-10-19 DIAGNOSIS — H6123 Impacted cerumen, bilateral: Secondary | ICD-10-CM | POA: Diagnosis not present

## 2019-11-22 DIAGNOSIS — R972 Elevated prostate specific antigen [PSA]: Secondary | ICD-10-CM | POA: Diagnosis not present

## 2019-11-22 DIAGNOSIS — N138 Other obstructive and reflux uropathy: Secondary | ICD-10-CM | POA: Diagnosis not present

## 2019-11-22 DIAGNOSIS — R350 Frequency of micturition: Secondary | ICD-10-CM | POA: Diagnosis not present

## 2019-11-22 DIAGNOSIS — N401 Enlarged prostate with lower urinary tract symptoms: Secondary | ICD-10-CM | POA: Diagnosis not present

## 2019-12-16 DIAGNOSIS — M109 Gout, unspecified: Secondary | ICD-10-CM | POA: Diagnosis not present

## 2019-12-16 DIAGNOSIS — I10 Essential (primary) hypertension: Secondary | ICD-10-CM | POA: Diagnosis not present

## 2019-12-16 DIAGNOSIS — J302 Other seasonal allergic rhinitis: Secondary | ICD-10-CM | POA: Diagnosis not present

## 2019-12-16 DIAGNOSIS — E782 Mixed hyperlipidemia: Secondary | ICD-10-CM | POA: Diagnosis not present

## 2019-12-16 DIAGNOSIS — Z6841 Body Mass Index (BMI) 40.0 and over, adult: Secondary | ICD-10-CM | POA: Diagnosis not present

## 2020-02-01 DIAGNOSIS — H61303 Acquired stenosis of external ear canal, unspecified, bilateral: Secondary | ICD-10-CM | POA: Diagnosis not present

## 2020-02-01 DIAGNOSIS — H6123 Impacted cerumen, bilateral: Secondary | ICD-10-CM | POA: Diagnosis not present

## 2020-02-11 DIAGNOSIS — L219 Seborrheic dermatitis, unspecified: Secondary | ICD-10-CM | POA: Diagnosis not present

## 2020-02-11 DIAGNOSIS — L299 Pruritus, unspecified: Secondary | ICD-10-CM | POA: Diagnosis not present

## 2020-02-11 DIAGNOSIS — L821 Other seborrheic keratosis: Secondary | ICD-10-CM | POA: Diagnosis not present

## 2020-03-16 DIAGNOSIS — D649 Anemia, unspecified: Secondary | ICD-10-CM | POA: Diagnosis not present

## 2020-03-16 DIAGNOSIS — I10 Essential (primary) hypertension: Secondary | ICD-10-CM | POA: Diagnosis not present

## 2020-03-16 DIAGNOSIS — E7849 Other hyperlipidemia: Secondary | ICD-10-CM | POA: Diagnosis not present

## 2020-04-03 ENCOUNTER — Ambulatory Visit: Payer: PPO | Admitting: Orthopedic Surgery

## 2020-04-03 ENCOUNTER — Ambulatory Visit: Payer: Self-pay

## 2020-04-03 VITALS — Ht 68.0 in | Wt 337.0 lb

## 2020-04-03 DIAGNOSIS — M25562 Pain in left knee: Secondary | ICD-10-CM

## 2020-04-03 DIAGNOSIS — M1712 Unilateral primary osteoarthritis, left knee: Secondary | ICD-10-CM | POA: Diagnosis not present

## 2020-04-05 ENCOUNTER — Encounter: Payer: Self-pay | Admitting: Orthopedic Surgery

## 2020-04-05 DIAGNOSIS — M1712 Unilateral primary osteoarthritis, left knee: Secondary | ICD-10-CM | POA: Diagnosis not present

## 2020-04-05 DIAGNOSIS — M25562 Pain in left knee: Secondary | ICD-10-CM | POA: Diagnosis not present

## 2020-04-05 MED ORDER — METHYLPREDNISOLONE ACETATE 40 MG/ML IJ SUSP
40.0000 mg | INTRAMUSCULAR | Status: AC | PRN
  Administered 2020-04-05: 40 mg via INTRA_ARTICULAR

## 2020-04-05 MED ORDER — LIDOCAINE HCL 1 % IJ SOLN
5.0000 mL | INTRAMUSCULAR | Status: AC | PRN
  Administered 2020-04-05: 5 mL

## 2020-04-05 MED ORDER — BUPIVACAINE HCL 0.25 % IJ SOLN
4.0000 mL | INTRAMUSCULAR | Status: AC | PRN
  Administered 2020-04-05: 4 mL via INTRA_ARTICULAR

## 2020-04-05 NOTE — Progress Notes (Signed)
Office Visit Note   Patient: Casey Donaldson.           Date of Birth: [birth year]           MRN: [id number] Visit Date: 04/03/2020 Requested by: Casey Sheriff, MD [place],  [place] [address] PCP: Casey Sheriff, MD  Subjective: Chief Complaint  Patient presents with  . Left Knee - Pain    HPI: Casey Donaldson is a 74 year old patient with left knee pain for 10 years.  No prior surgery on the left knee.  Did have right total knee replacement performed by another physician in town.  That was infected and had to be revised about 8 years ago.  He does report increasingly debilitating pain and limitation of walking endurance as well as limitation of activities of daily living associated with his left knee.  Describes locking and popping.  Denies any fevers or chills.  He still is relatively active working in the yard and garden.              ROS: All systems reviewed are negative as they relate to the chief complaint within the history of present illness.  Patient denies  fevers or chills.   Assessment & Plan: Visit Diagnoses:  1. Left knee pain, unspecified chronicity     Plan: Impression is left knee arthritis which is severe.  I think Casey Donaldson has a relatively high pain tolerance based on my experience with him and his revision surgery 8 years ago.  Nonetheless the knee is becoming more symptomatic.  We will try an injection today but I think he needs to decide for or against some type of surgical intervention in the future depending on his clinical symptoms.  Follow-up as needed  Follow-Up Instructions: Return if symptoms worsen or fail to improve.   Orders:  Orders Placed This Encounter  Procedures  . XR KNEE 3 VIEW LEFT   No orders of the defined types were placed in this encounter.     Procedures: Large Joint Inj: L knee on 04/05/2020 7:53 AM Indications: diagnostic evaluation, joint swelling and pain Details: 18 G 1.5 in needle, superolateral  approach  Arthrogram: No  Medications: 5 mL lidocaine 1 %; 40 mg methylPREDNISolone acetate 40 MG/ML; 4 mL bupivacaine 0.25 % Outcome: tolerated well, no immediate complications Procedure, treatment alternatives, risks and benefits explained, specific risks discussed. Consent was given by the patient. Immediately prior to procedure a time out was called to verify the correct patient, procedure, equipment, support staff and site/side marked as required. Patient was prepped and draped in the usual sterile fashion.       Clinical Data: No additional findings.  Objective: Vital Signs: Ht 5\' 8"  (1.727 m)   Wt (!) 337 lb (152.9 kg)   BMI 51.24 kg/m   Physical Exam:   Constitutional: Patient appears well-developed HEENT:  Head: Normocephalic Eyes:EOM are normal Neck: Normal range of motion Cardiovascular: Normal rate Pulmonary/chest: Effort normal Neurologic: Patient is alert Skin: Skin is warm Psychiatric: Patient has normal mood and affect    Ortho Exam: Ortho exam demonstrates some venous stasis changes bilateral lower extremities.  Both feet are perfused and sensate.  Does have varus alignment left lower extremity.  Extensor mechanism is intact.  Actually has close to full extension and flexion to about 115.  Collateral crucial ligaments are stable although there is some laxity in the lateral ligaments due to the patient's varus alignment.  Specialty Comments:  No specialty comments available.  Imaging: No results found.   PMFS History: Patient Active Problem List   Diagnosis Date Noted  . Infection of prosthetic knee joint ([place]) 05/18/2013  . Infection of total knee replacement ([place]) 02/26/2013    Class: Diagnosis of  . Urinary retention 02/26/2013   Past Medical History:  Diagnosis Date  . Anemia 02/2013   "after OR"   . Arthritis    "joints here and there" (05/20/2013)  . Complication of anesthesia    Pt reports dysphagia after last surgery. Pt. family  reports his memory was effected by anesth. as well, for several weeks. Pt. also reports that there has been use of  a regional anesthesia in the past & he experienced a change in his genitals  . GERD (gastroesophageal reflux disease)    takes OTC meds  . Heart murmur   . High cholesterol   . Hypertension   . Joint pain    ankles and wrists  . Joint swelling    wrists and ankles  . Melanoma of back ([place]) 1980's  . Sleep apnea    "haven't worn my mask much since 02/2013" (05/20/2013)    Family History  Problem Relation Age of Onset  . Coronary artery disease Father   . Hypertension Mother     Past Surgical History:  Procedure Laterality Date  . EXCISIONAL TOTAL KNEE ARTHROPLASTY WITH ANTIBIOTIC SPACERS Right 02/23/2013   Procedure: EXCISIONAL TOTAL KNEE ARTHROPLASTY WITH ANTIBIOTIC SPACERS;  Surgeon: Meredith Pel, MD;  Location: [facility];  Service: Orthopedics;  Laterality: Right;  Right Total Knee Arthroplasty Componenet Removal, Antibiotic Spacer Placement.  Marland Kitchen EYE SURGERY Left ~ 2009   "white of my eye started growing over iris" (05/20/2013)  . KNEE ARTHROSCOPY Left ~ 2005  . MELANOMA EXCISION  1980's   "off my back"  . TONSILLECTOMY    . TOTAL KNEE ARTHROPLASTY Right 02/16/2013; 05/20/2013  . TOTAL KNEE REVISION Right 05/18/2013   Procedure: TOTAL KNEE REVISION;  Surgeon: Meredith Pel, MD;  Location: [facility];  Service: Orthopedics;  Laterality: Right;  REMOVAL OF CEMENT SPACER, REINPLANTATION OF REVISION TOTAL KNEE ARTHROPLASTY   Social History   Occupational History  . Not on file  Tobacco Use  . Smoking status: Former Smoker    Packs/day: 1.00    Years: 15.00    Pack years: 15.00    Types: Cigarettes  . Smokeless tobacco: Former Systems developer    Types: Chew  . Tobacco comment: 05/20/2013 "stopped smoking in the late 1980's"  Substance and Sexual Activity  . Alcohol use: Yes    Comment: 05/20/2013 "I like a couple beers on the weekend; probably drink 6 pack/wk; nothing since  02/2013"  . Drug use: No  . Sexual activity: Never

## 2020-06-05 DIAGNOSIS — H61303 Acquired stenosis of external ear canal, unspecified, bilateral: Secondary | ICD-10-CM | POA: Diagnosis not present

## 2020-06-05 DIAGNOSIS — H938X1 Other specified disorders of right ear: Secondary | ICD-10-CM | POA: Diagnosis not present

## 2020-06-05 DIAGNOSIS — H6120 Impacted cerumen, unspecified ear: Secondary | ICD-10-CM | POA: Diagnosis not present

## 2020-06-22 DIAGNOSIS — G4733 Obstructive sleep apnea (adult) (pediatric): Secondary | ICD-10-CM | POA: Diagnosis not present

## 2020-08-09 ENCOUNTER — Telehealth: Payer: Self-pay | Admitting: Orthopedic Surgery

## 2020-08-09 MED ORDER — AMOXICILLIN 500 MG PO TABS: ORAL_TABLET | ORAL | 0 refills | Status: AC

## 2020-08-09 NOTE — Telephone Encounter (Signed)
Pt called and would like to know if you can prescribe the prescription for him to have a dental procedure. [facility] [address] [place]. He said he normally gets 12 for the whole year.

## 2020-08-09 NOTE — Addendum Note (Signed)
Addended byLaurann Montana on: 08/09/2020 04:27 PM   Modules accepted: Orders

## 2020-08-09 NOTE — Telephone Encounter (Signed)
Submitted to pharmacy. Patient advised done.  

## 2020-08-10 DIAGNOSIS — D649 Anemia, unspecified: Secondary | ICD-10-CM | POA: Diagnosis not present

## 2020-08-10 DIAGNOSIS — I10 Essential (primary) hypertension: Secondary | ICD-10-CM | POA: Diagnosis not present

## 2020-08-10 DIAGNOSIS — N400 Enlarged prostate without lower urinary tract symptoms: Secondary | ICD-10-CM | POA: Diagnosis not present

## 2020-08-10 DIAGNOSIS — Z0000 Encounter for general adult medical examination without abnormal findings: Secondary | ICD-10-CM | POA: Diagnosis not present

## 2020-08-10 DIAGNOSIS — Z1331 Encounter for screening for depression: Secondary | ICD-10-CM | POA: Diagnosis not present

## 2020-08-10 DIAGNOSIS — Z79899 Other long term (current) drug therapy: Secondary | ICD-10-CM | POA: Diagnosis not present

## 2020-08-10 DIAGNOSIS — E782 Mixed hyperlipidemia: Secondary | ICD-10-CM | POA: Diagnosis not present

## 2020-08-10 DIAGNOSIS — M109 Gout, unspecified: Secondary | ICD-10-CM | POA: Diagnosis not present

## 2020-08-10 DIAGNOSIS — Z6841 Body Mass Index (BMI) 40.0 and over, adult: Secondary | ICD-10-CM | POA: Diagnosis not present

## 2020-08-10 DIAGNOSIS — Z23 Encounter for immunization: Secondary | ICD-10-CM | POA: Diagnosis not present

## 2020-11-20 DIAGNOSIS — R972 Elevated prostate specific antigen [PSA]: Secondary | ICD-10-CM | POA: Diagnosis not present

## 2020-11-20 DIAGNOSIS — N401 Enlarged prostate with lower urinary tract symptoms: Secondary | ICD-10-CM | POA: Diagnosis not present

## 2020-11-20 DIAGNOSIS — R350 Frequency of micturition: Secondary | ICD-10-CM | POA: Diagnosis not present

## 2020-11-20 DIAGNOSIS — N138 Other obstructive and reflux uropathy: Secondary | ICD-10-CM | POA: Diagnosis not present

## 2020-12-04 DIAGNOSIS — H61303 Acquired stenosis of external ear canal, unspecified, bilateral: Secondary | ICD-10-CM | POA: Diagnosis not present

## 2020-12-04 DIAGNOSIS — H938X1 Other specified disorders of right ear: Secondary | ICD-10-CM | POA: Diagnosis not present

## 2020-12-04 DIAGNOSIS — H6121 Impacted cerumen, right ear: Secondary | ICD-10-CM | POA: Diagnosis not present

## 2021-02-13 DIAGNOSIS — L814 Other melanin hyperpigmentation: Secondary | ICD-10-CM | POA: Diagnosis not present

## 2021-02-13 DIAGNOSIS — L918 Other hypertrophic disorders of the skin: Secondary | ICD-10-CM | POA: Diagnosis not present

## 2021-02-13 DIAGNOSIS — L821 Other seborrheic keratosis: Secondary | ICD-10-CM | POA: Diagnosis not present

## 2021-03-07 DIAGNOSIS — G4733 Obstructive sleep apnea (adult) (pediatric): Secondary | ICD-10-CM | POA: Diagnosis not present

## 2021-04-02 DIAGNOSIS — Z23 Encounter for immunization: Secondary | ICD-10-CM | POA: Diagnosis not present

## 2021-04-02 DIAGNOSIS — I10 Essential (primary) hypertension: Secondary | ICD-10-CM | POA: Diagnosis not present

## 2021-04-02 DIAGNOSIS — E7800 Pure hypercholesterolemia, unspecified: Secondary | ICD-10-CM | POA: Diagnosis not present

## 2021-04-02 DIAGNOSIS — J302 Other seasonal allergic rhinitis: Secondary | ICD-10-CM | POA: Diagnosis not present

## 2021-04-02 DIAGNOSIS — M109 Gout, unspecified: Secondary | ICD-10-CM | POA: Diagnosis not present

## 2021-04-02 DIAGNOSIS — Z79899 Other long term (current) drug therapy: Secondary | ICD-10-CM | POA: Diagnosis not present

## 2021-04-02 DIAGNOSIS — Z6841 Body Mass Index (BMI) 40.0 and over, adult: Secondary | ICD-10-CM | POA: Diagnosis not present

## 2021-05-08 DIAGNOSIS — H61303 Acquired stenosis of external ear canal, unspecified, bilateral: Secondary | ICD-10-CM | POA: Diagnosis not present

## 2021-05-08 DIAGNOSIS — H6123 Impacted cerumen, bilateral: Secondary | ICD-10-CM | POA: Diagnosis not present

## 2021-08-29 DIAGNOSIS — E782 Mixed hyperlipidemia: Secondary | ICD-10-CM | POA: Diagnosis not present

## 2021-08-29 DIAGNOSIS — Z1331 Encounter for screening for depression: Secondary | ICD-10-CM | POA: Diagnosis not present

## 2021-08-29 DIAGNOSIS — Z0000 Encounter for general adult medical examination without abnormal findings: Secondary | ICD-10-CM | POA: Diagnosis not present

## 2021-08-29 DIAGNOSIS — D649 Anemia, unspecified: Secondary | ICD-10-CM | POA: Diagnosis not present

## 2021-08-29 DIAGNOSIS — I10 Essential (primary) hypertension: Secondary | ICD-10-CM | POA: Diagnosis not present

## 2021-08-29 DIAGNOSIS — M109 Gout, unspecified: Secondary | ICD-10-CM | POA: Diagnosis not present

## 2021-08-29 DIAGNOSIS — Z6841 Body Mass Index (BMI) 40.0 and over, adult: Secondary | ICD-10-CM | POA: Diagnosis not present

## 2021-08-29 DIAGNOSIS — N400 Enlarged prostate without lower urinary tract symptoms: Secondary | ICD-10-CM | POA: Diagnosis not present

## 2021-11-08 DIAGNOSIS — H61303 Acquired stenosis of external ear canal, unspecified, bilateral: Secondary | ICD-10-CM | POA: Diagnosis not present

## 2021-11-08 DIAGNOSIS — H6123 Impacted cerumen, bilateral: Secondary | ICD-10-CM | POA: Diagnosis not present

## 2021-11-08 DIAGNOSIS — H9190 Unspecified hearing loss, unspecified ear: Secondary | ICD-10-CM | POA: Diagnosis not present

## 2021-11-28 DIAGNOSIS — N401 Enlarged prostate with lower urinary tract symptoms: Secondary | ICD-10-CM | POA: Diagnosis not present

## 2021-11-28 DIAGNOSIS — R972 Elevated prostate specific antigen [PSA]: Secondary | ICD-10-CM | POA: Diagnosis not present

## 2021-11-28 DIAGNOSIS — N138 Other obstructive and reflux uropathy: Secondary | ICD-10-CM | POA: Diagnosis not present

## 2021-12-14 DIAGNOSIS — G4733 Obstructive sleep apnea (adult) (pediatric): Secondary | ICD-10-CM | POA: Diagnosis not present

## 2022-03-07 DIAGNOSIS — L814 Other melanin hyperpigmentation: Secondary | ICD-10-CM | POA: Diagnosis not present

## 2022-03-07 DIAGNOSIS — D2239 Melanocytic nevi of other parts of face: Secondary | ICD-10-CM | POA: Diagnosis not present

## 2022-03-07 DIAGNOSIS — L570 Actinic keratosis: Secondary | ICD-10-CM | POA: Diagnosis not present

## 2022-03-07 DIAGNOSIS — L821 Other seborrheic keratosis: Secondary | ICD-10-CM | POA: Diagnosis not present

## 2022-03-07 DIAGNOSIS — D225 Melanocytic nevi of trunk: Secondary | ICD-10-CM | POA: Diagnosis not present

## 2022-03-12 DIAGNOSIS — M109 Gout, unspecified: Secondary | ICD-10-CM | POA: Diagnosis not present

## 2022-03-12 DIAGNOSIS — R5383 Other fatigue: Secondary | ICD-10-CM | POA: Diagnosis not present

## 2022-03-12 DIAGNOSIS — I10 Essential (primary) hypertension: Secondary | ICD-10-CM | POA: Diagnosis not present

## 2022-03-12 DIAGNOSIS — D649 Anemia, unspecified: Secondary | ICD-10-CM | POA: Diagnosis not present

## 2022-03-12 DIAGNOSIS — Z6841 Body Mass Index (BMI) 40.0 and over, adult: Secondary | ICD-10-CM | POA: Diagnosis not present

## 2022-03-12 DIAGNOSIS — K009 Disorder of tooth development, unspecified: Secondary | ICD-10-CM | POA: Diagnosis not present

## 2022-03-12 DIAGNOSIS — E782 Mixed hyperlipidemia: Secondary | ICD-10-CM | POA: Diagnosis not present

## 2022-03-12 DIAGNOSIS — R600 Localized edema: Secondary | ICD-10-CM | POA: Diagnosis not present

## 2022-05-14 DIAGNOSIS — H6123 Impacted cerumen, bilateral: Secondary | ICD-10-CM | POA: Diagnosis not present

## 2022-05-14 DIAGNOSIS — H61303 Acquired stenosis of external ear canal, unspecified, bilateral: Secondary | ICD-10-CM | POA: Diagnosis not present

## 2022-09-09 DIAGNOSIS — Z0000 Encounter for general adult medical examination without abnormal findings: Secondary | ICD-10-CM | POA: Diagnosis not present

## 2022-09-09 DIAGNOSIS — E782 Mixed hyperlipidemia: Secondary | ICD-10-CM | POA: Diagnosis not present

## 2022-09-09 DIAGNOSIS — I4510 Unspecified right bundle-branch block: Secondary | ICD-10-CM | POA: Diagnosis not present

## 2022-09-09 DIAGNOSIS — Z6841 Body Mass Index (BMI) 40.0 and over, adult: Secondary | ICD-10-CM | POA: Diagnosis not present

## 2022-09-09 DIAGNOSIS — I10 Essential (primary) hypertension: Secondary | ICD-10-CM | POA: Diagnosis not present

## 2022-09-09 DIAGNOSIS — D649 Anemia, unspecified: Secondary | ICD-10-CM | POA: Diagnosis not present

## 2022-11-12 DIAGNOSIS — H61303 Acquired stenosis of external ear canal, unspecified, bilateral: Secondary | ICD-10-CM | POA: Diagnosis not present

## 2022-11-12 DIAGNOSIS — H6123 Impacted cerumen, bilateral: Secondary | ICD-10-CM | POA: Diagnosis not present

## 2022-12-03 DIAGNOSIS — N138 Other obstructive and reflux uropathy: Secondary | ICD-10-CM | POA: Diagnosis not present

## 2022-12-03 DIAGNOSIS — N401 Enlarged prostate with lower urinary tract symptoms: Secondary | ICD-10-CM | POA: Diagnosis not present

## 2022-12-03 DIAGNOSIS — R972 Elevated prostate specific antigen [PSA]: Secondary | ICD-10-CM | POA: Diagnosis not present

## 2023-02-27 DIAGNOSIS — D2239 Melanocytic nevi of other parts of face: Secondary | ICD-10-CM | POA: Diagnosis not present

## 2023-02-27 DIAGNOSIS — L300 Nummular dermatitis: Secondary | ICD-10-CM | POA: Diagnosis not present

## 2023-02-27 DIAGNOSIS — L814 Other melanin hyperpigmentation: Secondary | ICD-10-CM | POA: Diagnosis not present

## 2023-02-27 DIAGNOSIS — D225 Melanocytic nevi of trunk: Secondary | ICD-10-CM | POA: Diagnosis not present

## 2023-02-27 DIAGNOSIS — L820 Inflamed seborrheic keratosis: Secondary | ICD-10-CM | POA: Diagnosis not present

## 2023-03-04 DIAGNOSIS — I10 Essential (primary) hypertension: Secondary | ICD-10-CM | POA: Diagnosis not present

## 2023-03-04 DIAGNOSIS — E782 Mixed hyperlipidemia: Secondary | ICD-10-CM | POA: Diagnosis not present

## 2023-03-11 DIAGNOSIS — Z6841 Body Mass Index (BMI) 40.0 and over, adult: Secondary | ICD-10-CM | POA: Diagnosis not present

## 2023-03-11 DIAGNOSIS — E782 Mixed hyperlipidemia: Secondary | ICD-10-CM | POA: Diagnosis not present

## 2023-03-11 DIAGNOSIS — H11009 Unspecified pterygium of unspecified eye: Secondary | ICD-10-CM | POA: Diagnosis not present

## 2023-03-11 DIAGNOSIS — I10 Essential (primary) hypertension: Secondary | ICD-10-CM | POA: Diagnosis not present

## 2023-03-11 DIAGNOSIS — R739 Hyperglycemia, unspecified: Secondary | ICD-10-CM | POA: Diagnosis not present

## 2023-03-11 DIAGNOSIS — M109 Gout, unspecified: Secondary | ICD-10-CM | POA: Diagnosis not present

## 2023-04-16 DIAGNOSIS — Z9181 History of falling: Secondary | ICD-10-CM | POA: Diagnosis not present

## 2023-04-16 DIAGNOSIS — E1165 Type 2 diabetes mellitus with hyperglycemia: Secondary | ICD-10-CM | POA: Diagnosis not present

## 2023-04-16 DIAGNOSIS — Z6841 Body Mass Index (BMI) 40.0 and over, adult: Secondary | ICD-10-CM | POA: Diagnosis not present

## 2023-04-16 DIAGNOSIS — Z1331 Encounter for screening for depression: Secondary | ICD-10-CM | POA: Diagnosis not present

## 2023-04-16 DIAGNOSIS — Z1339 Encounter for screening examination for other mental health and behavioral disorders: Secondary | ICD-10-CM | POA: Diagnosis not present

## 2023-04-18 DIAGNOSIS — G4733 Obstructive sleep apnea (adult) (pediatric): Secondary | ICD-10-CM | POA: Diagnosis not present

## 2023-04-21 DIAGNOSIS — H11001 Unspecified pterygium of right eye: Secondary | ICD-10-CM | POA: Diagnosis not present

## 2023-05-12 DIAGNOSIS — H6123 Impacted cerumen, bilateral: Secondary | ICD-10-CM | POA: Diagnosis not present

## 2023-06-04 DIAGNOSIS — Z01818 Encounter for other preprocedural examination: Secondary | ICD-10-CM | POA: Diagnosis not present

## 2023-06-04 DIAGNOSIS — H11001 Unspecified pterygium of right eye: Secondary | ICD-10-CM | POA: Diagnosis not present

## 2023-06-25 DIAGNOSIS — H11001 Unspecified pterygium of right eye: Secondary | ICD-10-CM | POA: Diagnosis not present

## 2023-06-25 DIAGNOSIS — G4733 Obstructive sleep apnea (adult) (pediatric): Secondary | ICD-10-CM | POA: Diagnosis not present

## 2023-07-11 DIAGNOSIS — E1165 Type 2 diabetes mellitus with hyperglycemia: Secondary | ICD-10-CM | POA: Diagnosis not present

## 2023-07-17 DIAGNOSIS — E1122 Type 2 diabetes mellitus with diabetic chronic kidney disease: Secondary | ICD-10-CM | POA: Diagnosis not present

## 2023-07-17 DIAGNOSIS — R809 Proteinuria, unspecified: Secondary | ICD-10-CM | POA: Diagnosis not present

## 2023-07-17 DIAGNOSIS — Z6841 Body Mass Index (BMI) 40.0 and over, adult: Secondary | ICD-10-CM | POA: Diagnosis not present

## 2023-07-17 DIAGNOSIS — E1165 Type 2 diabetes mellitus with hyperglycemia: Secondary | ICD-10-CM | POA: Diagnosis not present

## 2023-07-20 DIAGNOSIS — G4733 Obstructive sleep apnea (adult) (pediatric): Secondary | ICD-10-CM | POA: Diagnosis not present

## 2023-08-18 DIAGNOSIS — E1165 Type 2 diabetes mellitus with hyperglycemia: Secondary | ICD-10-CM | POA: Diagnosis not present

## 2023-10-09 DIAGNOSIS — E1165 Type 2 diabetes mellitus with hyperglycemia: Secondary | ICD-10-CM | POA: Diagnosis not present

## 2023-10-14 DIAGNOSIS — E1165 Type 2 diabetes mellitus with hyperglycemia: Secondary | ICD-10-CM | POA: Diagnosis not present

## 2023-10-14 DIAGNOSIS — Z6841 Body Mass Index (BMI) 40.0 and over, adult: Secondary | ICD-10-CM | POA: Diagnosis not present

## 2023-10-14 DIAGNOSIS — E1129 Type 2 diabetes mellitus with other diabetic kidney complication: Secondary | ICD-10-CM | POA: Diagnosis not present

## 2023-10-14 DIAGNOSIS — Z792 Long term (current) use of antibiotics: Secondary | ICD-10-CM | POA: Diagnosis not present

## 2023-10-14 DIAGNOSIS — R809 Proteinuria, unspecified: Secondary | ICD-10-CM | POA: Diagnosis not present

## 2023-10-19 DIAGNOSIS — G4733 Obstructive sleep apnea (adult) (pediatric): Secondary | ICD-10-CM | POA: Diagnosis not present

## 2023-12-04 DIAGNOSIS — L918 Other hypertrophic disorders of the skin: Secondary | ICD-10-CM | POA: Diagnosis not present

## 2024-01-20 DIAGNOSIS — G4733 Obstructive sleep apnea (adult) (pediatric): Secondary | ICD-10-CM | POA: Diagnosis not present

## 2024-01-28 DIAGNOSIS — Z1330 Encounter for screening examination for mental health and behavioral disorders, unspecified: Secondary | ICD-10-CM | POA: Diagnosis not present

## 2024-01-28 DIAGNOSIS — N401 Enlarged prostate with lower urinary tract symptoms: Secondary | ICD-10-CM | POA: Diagnosis not present

## 2024-01-28 DIAGNOSIS — N138 Other obstructive and reflux uropathy: Secondary | ICD-10-CM | POA: Diagnosis not present

## 2024-03-02 DIAGNOSIS — L814 Other melanin hyperpigmentation: Secondary | ICD-10-CM | POA: Diagnosis not present

## 2024-03-02 DIAGNOSIS — L821 Other seborrheic keratosis: Secondary | ICD-10-CM | POA: Diagnosis not present

## 2024-03-02 DIAGNOSIS — D2239 Melanocytic nevi of other parts of face: Secondary | ICD-10-CM | POA: Diagnosis not present

## 2024-03-02 DIAGNOSIS — H6123 Impacted cerumen, bilateral: Secondary | ICD-10-CM | POA: Diagnosis not present

## 2024-03-02 DIAGNOSIS — D225 Melanocytic nevi of trunk: Secondary | ICD-10-CM | POA: Diagnosis not present

## 2024-03-02 DIAGNOSIS — L820 Inflamed seborrheic keratosis: Secondary | ICD-10-CM | POA: Diagnosis not present

## 2024-04-08 DIAGNOSIS — E1165 Type 2 diabetes mellitus with hyperglycemia: Secondary | ICD-10-CM | POA: Diagnosis not present

## 2024-04-08 DIAGNOSIS — E782 Mixed hyperlipidemia: Secondary | ICD-10-CM | POA: Diagnosis not present

## 2024-04-14 DIAGNOSIS — R011 Cardiac murmur, unspecified: Secondary | ICD-10-CM | POA: Diagnosis not present

## 2024-04-14 DIAGNOSIS — E1121 Type 2 diabetes mellitus with diabetic nephropathy: Secondary | ICD-10-CM | POA: Diagnosis not present

## 2024-04-14 DIAGNOSIS — R809 Proteinuria, unspecified: Secondary | ICD-10-CM | POA: Diagnosis not present

## 2024-04-14 DIAGNOSIS — M109 Gout, unspecified: Secondary | ICD-10-CM | POA: Diagnosis not present

## 2024-04-14 DIAGNOSIS — I10 Essential (primary) hypertension: Secondary | ICD-10-CM | POA: Diagnosis not present

## 2024-04-14 DIAGNOSIS — I499 Cardiac arrhythmia, unspecified: Secondary | ICD-10-CM | POA: Diagnosis not present

## 2024-04-14 DIAGNOSIS — E1129 Type 2 diabetes mellitus with other diabetic kidney complication: Secondary | ICD-10-CM | POA: Diagnosis not present

## 2024-04-14 DIAGNOSIS — E1165 Type 2 diabetes mellitus with hyperglycemia: Secondary | ICD-10-CM | POA: Diagnosis not present

## 2024-04-14 DIAGNOSIS — J309 Allergic rhinitis, unspecified: Secondary | ICD-10-CM | POA: Diagnosis not present

## 2024-04-14 DIAGNOSIS — Z6841 Body Mass Index (BMI) 40.0 and over, adult: Secondary | ICD-10-CM | POA: Diagnosis not present

## 2024-04-14 DIAGNOSIS — E782 Mixed hyperlipidemia: Secondary | ICD-10-CM | POA: Diagnosis not present

## 2024-04-23 DIAGNOSIS — N400 Enlarged prostate without lower urinary tract symptoms: Secondary | ICD-10-CM | POA: Diagnosis not present

## 2024-05-03 DIAGNOSIS — R972 Elevated prostate specific antigen [PSA]: Secondary | ICD-10-CM | POA: Diagnosis not present

## 2024-05-03 DIAGNOSIS — N400 Enlarged prostate without lower urinary tract symptoms: Secondary | ICD-10-CM | POA: Diagnosis not present

## 2024-05-07 DIAGNOSIS — R011 Cardiac murmur, unspecified: Secondary | ICD-10-CM | POA: Diagnosis not present
